# Patient Record
Sex: Female | Born: 2000 | Race: White | Hispanic: No | Marital: Single | Smoking: Never smoker
Health system: Southern US, Community
[De-identification: ages and names within clinical notes are randomized; demographics above are authoritative.]

## PROBLEM LIST (undated history)

## (undated) DIAGNOSIS — T7840XA Allergy, unspecified, initial encounter: Secondary | ICD-10-CM

## (undated) DIAGNOSIS — Z789 Other specified health status: Secondary | ICD-10-CM

## (undated) DIAGNOSIS — D649 Anemia, unspecified: Secondary | ICD-10-CM

## (undated) DIAGNOSIS — R112 Nausea with vomiting, unspecified: Secondary | ICD-10-CM

## (undated) DIAGNOSIS — Z9889 Other specified postprocedural states: Secondary | ICD-10-CM

## (undated) HISTORY — DX: Allergy, unspecified, initial encounter: T78.40XA

## (undated) NOTE — *Deleted (*Deleted)
[facility]   Telephone:([phone] [phone]   Clinic Follow up Note   Patient Care Team: Patient, No Pcp Per as PCP - General (General Practice)  Date of Service:  11/25/2020  CHIEF COMPLAINT: F/u of IDA   CURRENT THERAPY:  Oral liquid iron and Prenatal Vitamin  INTERVAL HISTORY: *** Janet Rojas is here for a follow up of IDA. She was last seen by me 1 year ago. She presents to the clinic alone.    REVIEW OF SYSTEMS:  *** Constitutional: Denies fevers, chills or abnormal weight loss Eyes: Denies blurriness of vision Ears, nose, mouth, throat, and face: Denies mucositis or sore throat Respiratory: Denies cough, dyspnea or wheezes Cardiovascular: Denies palpitation, chest discomfort or lower extremity swelling Gastrointestinal:  Denies nausea, heartburn or change in bowel habits Skin: Denies abnormal skin rashes Lymphatics: Denies new lymphadenopathy or easy bruising Neurological:Denies numbness, tingling or new weaknesses Behavioral/Psych: Mood is stable, no new changes  All other systems were reviewed with the patient and are negative.  MEDICAL HISTORY:  Past Medical History:  Diagnosis Date  . Allergy   . Anemia    IDA currently well controlled  . Medical history non-contributory   . PONV (postoperative nausea and vomiting)    sick after wisdom teeth    SURGICAL HISTORY: Past Surgical History:  Procedure Laterality Date  . TONSILLECTOMY AND ADENOIDECTOMY Bilateral 06/17/2020   Procedure: TONSILLECTOMY AND ADENOIDECTOMY;  Surgeon: Newman Pies, MD;  Location: [facility];  Service: ENT;  Laterality: Bilateral;  . WISDOM TOOTH EXTRACTION  2019    I have reviewed the social history and family history with the patient and they are unchanged from previous note.  ALLERGIES:  has No Known Allergies.  MEDICATIONS:  Current Outpatient Medications  Medication Sig Dispense Refill  . cholecalciferol (VITAMIN D3) 25 MCG (1000  UNIT) tablet Take 2,000 Units by mouth daily.    Marland Kitchen loratadine-pseudoephedrine (CLARITIN-D 12-HOUR) 5-120 MG tablet Take 1 tablet by mouth as needed for allergies.    . Prenatal Vit-Fe Fumarate-FA (MULTIVITAMIN-PRENATAL) 27-0.8 MG TABS tablet Take 1 tablet by mouth daily at 12 noon.    . triamcinolone ointment (KENALOG) 0.5 % Apply 1 application topically 2 (two) times daily. (Patient taking differently: Apply 1 application topically 2 (two) times daily as needed. ) 30 g 0   No current facility-administered medications for this visit.    PHYSICAL EXAMINATION: ECOG PERFORMANCE STATUS: {CHL ONC ECOG PS:[id number]}  There were no vitals filed for this visit. There were no vitals filed for this visit. *** GENERAL:alert, no distress and comfortable SKIN: skin color, texture, turgor are normal, no rashes or significant lesions EYES: normal, Conjunctiva are pink and non-injected, sclera clear {OROPHARYNX:no exudate, no erythema and lips, buccal mucosa, and tongue normal}  NECK: supple, thyroid normal size, non-tender, without nodularity LYMPH:  no palpable lymphadenopathy in the cervical, axillary {or inguinal} LUNGS: clear to auscultation and percussion with normal breathing effort HEART: regular rate & rhythm and no murmurs and no lower extremity edema ABDOMEN:abdomen soft, non-tender and normal bowel sounds Musculoskeletal:no cyanosis of digits and no clubbing  NEURO: alert & oriented x 3 with fluent speech, no focal motor/sensory deficits  LABORATORY DATA:  I have reviewed the data as listed CBC Latest Ref Rng & Units 06/05/2020 10/03/2019 05/24/2019  WBC 4.0 - 10.5 K/uL 8.5 9.8 7.8  Hemoglobin 12.0 - 15.0 g/dL 65.7 84.6 96.2  Hematocrit 36 - 46 % 42.3 42.8 41.9  Platelets 150 -  400 K/uL 257 271 288     CMP Latest Ref Rng & Units 06/05/2020 10/03/2019 05/24/2019  Glucose 70 - 99 mg/dL 83 89 161(W)  BUN 6 - 20 mg/dL 9 8 9   Creatinine 0.44 - 1.00 mg/dL 9.60 4.54 0.98  Sodium 135 - 145  mmol/L 139 140 138  Potassium 3.5 - 5.1 mmol/L 4.0 3.8 3.8  Chloride 98 - 111 mmol/L 104 105 104  CO2 22 - 32 mmol/L 29 26 28   Calcium 8.9 - 10.3 mg/dL 9.3 9.2 9.4  Total Protein 6.5 - 8.1 g/dL 7.5 7.7 7.8  Total Bilirubin 0.3 - 1.2 mg/dL 0.5 0.6 0.6  Alkaline Phos 38 - 126 U/L 78 86 85  AST 15 - 41 U/L 17 17 13(L)  ALT 0 - 44 U/L 18 17 14       RADIOGRAPHIC STUDIES: I have personally reviewed the radiological images as listed and agreed with the findings in the report. No results found.   ASSESSMENT & PLAN:  Janet Rojas is a 77 y.o. female with    1. Iron deficient Anemia, secondary to menorrhagia  -There is no record of anemia in Epic, but per patient she had low levels in the past. She still remains to have fatigue but able to function.  -I previously discussed this is related to her menorrhagia.  -She tried oral iron with upset stomach. She has been able to tolerate Prenatal vitamin daily much better, will continue and to try oral liquid iron.   -She has not required IV iron in the past, but she is interested. Given manageable levels, IV iron is not indicated at this time.  -Labs *** -Continue prenatal vitamin and try liquid oral iron 1-2 times a day after eating and take with Orange Juice.  -I also encouraged her to increase iron with read meat in her diet.  -Repeat labs in 6 months or sooner if symplasmatic.  -f/u in 1 year    2. Menorrhagia  -She continues to have heavy menses 4 days out of her regular monthly period.  -She is not on birth control. I recommend she f/u with Gyn to discuss starting birth control to manage her heavy periods.    PLAN:  *** -Continue prenatal vitamin and try liquid oral iron 1-2 times a day after eating and take with Orange Juice.  -Lab in 6 months  -Lab and f/u in 1 year.  -I called her mother and left her a VM to update her    No problem-specific Assessment & Plan notes found for this encounter.   No orders of the defined  types were placed in this encounter.  All questions were answered. The patient knows to call the clinic with any problems, questions or concerns. No barriers to learning was detected. The total time spent in the appointment was {CHL ONC TIME VISIT - [id number]}.     Delphina Cahill 11/25/2020   Rogelia Rohrer, am acting as scribe for Malachy Mood, MD.   {Add scribe attestation statement}

---
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
## 2019-05-15 ENCOUNTER — Telehealth: Payer: Self-pay | Admitting: Internal Medicine

## 2019-05-15 NOTE — Telephone Encounter (Signed)
Former [facility] patient scheduled for lab/fu per 5/15 schedule message. Confirmed with patient.

## 2019-05-23 ENCOUNTER — Other Ambulatory Visit: Payer: Self-pay | Admitting: Internal Medicine

## 2019-05-23 DIAGNOSIS — D500 Iron deficiency anemia secondary to blood loss (chronic): Secondary | ICD-10-CM

## 2019-05-24 ENCOUNTER — Other Ambulatory Visit: Payer: Self-pay

## 2019-05-24 ENCOUNTER — Inpatient Hospital Stay: Payer: Medicaid Other | Attending: Internal Medicine

## 2019-05-24 DIAGNOSIS — E611 Iron deficiency: Secondary | ICD-10-CM | POA: Insufficient documentation

## 2019-05-24 DIAGNOSIS — D500 Iron deficiency anemia secondary to blood loss (chronic): Secondary | ICD-10-CM

## 2019-05-24 LAB — CMP (CANCER CENTER ONLY)
ALT: 14 U/L (ref 0–44)
AST: 13 U/L — ABNORMAL LOW (ref 15–41)
Albumin: 4.2 g/dL (ref 3.5–5.0)
Alkaline Phosphatase: 85 U/L (ref 38–126)
Anion gap: 6 (ref 5–15)
BUN: 9 mg/dL (ref 6–20)
CO2: 28 mmol/L (ref 22–32)
Calcium: 9.4 mg/dL (ref 8.9–10.3)
Chloride: 104 mmol/L (ref 98–111)
Creatinine: 0.76 mg/dL (ref 0.44–1.00)
GFR, Est AFR Am: >60 mL/min (ref >60)
GFR, Estimated: >60 mL/min (ref >60)
Glucose, Bld: 105 mg/dL — ABNORMAL HIGH (ref 70–99)
Potassium: 3.8 mmol/L (ref 3.5–5.1)
Sodium: 138 mmol/L (ref 135–145)
Total Bilirubin: 0.6 mg/dL (ref 0.3–1.2)
Total Protein: 7.8 g/dL (ref 6.5–8.1)

## 2019-05-24 LAB — CBC WITH DIFFERENTIAL (CANCER CENTER ONLY)
Abs Immature Granulocytes: 0.01 10*3/uL (ref 0.00–0.07)
Basophils Absolute: 0.1 10*3/uL (ref 0.0–0.1)
Basophils Relative: 1 %
Eosinophils Absolute: 0.2 10*3/uL (ref 0.0–0.5)
Eosinophils Relative: 3 %
HCT: 41.9 % (ref 36.0–46.0)
Hemoglobin: 14.3 g/dL (ref 12.0–15.0)
Immature Granulocytes: 0 %
Lymphocytes Relative: 23 %
Lymphs Abs: 1.8 10*3/uL (ref 0.7–4.0)
MCH: 29.9 pg (ref 26.0–34.0)
MCHC: 34.1 g/dL (ref 30.0–36.0)
MCV: 87.5 fL (ref 80.0–100.0)
Monocytes Absolute: 0.6 10*3/uL (ref 0.1–1.0)
Monocytes Relative: 7 %
Neutro Abs: 5.2 10*3/uL (ref 1.7–7.7)
Neutrophils Relative %: 66 %
Platelet Count: 288 10*3/uL (ref 150–400)
RBC: 4.79 MIL/uL (ref 3.87–5.11)
RDW: 12.3 % (ref 11.5–15.5)
WBC Count: 7.8 10*3/uL (ref 4.0–10.5)
nRBC: 0 % (ref 0.0–0.2)

## 2019-05-24 LAB — IRON AND TIBC
Iron: 129 ug/dL (ref 41–142)
Saturation Ratios: 38 % (ref 21–57)
TIBC: 335 ug/dL (ref 236–444)
UIBC: 207 ug/dL (ref 120–384)

## 2019-05-24 LAB — FERRITIN: Ferritin: 28 ng/mL (ref 11–307)

## 2019-05-24 LAB — LACTATE DEHYDROGENASE: LDH: 135 U/L (ref 98–192)

## 2019-05-25 LAB — PROTEIN ELECTROPHORESIS, SERUM, WITH REFLEX
A/G Ratio: 1.2 (ref 0.7–1.7)
Albumin ELP: 3.9 g/dL (ref 2.9–4.4)
Alpha-1-Globulin: 0.2 g/dL (ref 0.0–0.4)
Alpha-2-Globulin: 0.7 g/dL (ref 0.4–1.0)
Beta Globulin: 1.1 g/dL (ref 0.7–1.3)
Gamma Globulin: 1.3 g/dL (ref 0.4–1.8)
Globulin, Total: 3.3 g/dL (ref 2.2–3.9)
Total Protein ELP: 7.2 g/dL (ref 6.0–8.5)

## 2019-05-31 ENCOUNTER — Inpatient Hospital Stay: Payer: Medicaid Other | Attending: Internal Medicine | Admitting: Internal Medicine

## 2019-05-31 ENCOUNTER — Other Ambulatory Visit: Payer: Self-pay

## 2019-05-31 VITALS — BP 105/95 | HR 83 | Temp 98.6°F | Resp 17 | Ht 62.5 in | Wt 158.6 lb

## 2019-05-31 DIAGNOSIS — D509 Iron deficiency anemia, unspecified: Secondary | ICD-10-CM | POA: Diagnosis not present

## 2019-05-31 DIAGNOSIS — Z79899 Other long term (current) drug therapy: Secondary | ICD-10-CM | POA: Diagnosis not present

## 2019-05-31 DIAGNOSIS — N920 Excessive and frequent menstruation with regular cycle: Secondary | ICD-10-CM | POA: Diagnosis not present

## 2019-05-31 DIAGNOSIS — D500 Iron deficiency anemia secondary to blood loss (chronic): Secondary | ICD-10-CM

## 2019-05-31 NOTE — Progress Notes (Signed)
Diagnosis No diagnosis found.  Staging Cancer Staging No matching staging information was found for the patient.  Assessment and Plan:  1.  Iron deficiency anemia.  Pt previously followed by Dr. Caron Presume.  She was previously recommended for oral iron. She reports she stopped taking oral iron several weeks ago.    Labs done 05/24/2019 reviewed and showed WBC 7.8 HB 14.3 plts 288,000.  Chemistries WNL with K+ 3.8 Cr 0.76 normal LFTs.  Ferritin low normal at 28.  SPEP negative.    At pt request, spoke with Mother on phone who was questioning option of IV iron which she reports she takes.  Pt has option of trial of IV iron or continue oral iron as HB WNL at 14.  Have given her options of trying Vitamin with iron if having problems tolerating oral iron.  Pt should follow-up with GYN if problems with menorrhagia.  Mother will notify the office if pt desires IV iron.  Pt will RTC in 11/2019 with labs and follow-up.    2.  Menorrhagia.  Likely etiology of IDA.  Pt should follow-up with GYN as directed.    3.  Compliance.  Pt reports she stopped taking oral iron several weeks ago.  Have given her options of trying Vitamin with iron if having problems tolerating oral iron.    4.  Fatigue.  TSH normal at 1.150.  Hb adequate at 14.  Follow-up with PCP if ongoing symptoms.    25 minutes spent with more than 50% spent in review of records, counseling and coordination of care.     Interval History:  Historical data obtained from note dated 10/18/2018.  Pt previously followed by Dr. Caron Presume.  Reportedly after she donated blood in October 2019, she became excessively fatigued.  Immediately following her phlebotomy, she had dizziness and lightheadedness.  She was subsequently notified by mail that she was mildly anemic. She was on iron by mouth 1 tablet once daily until November 2018.  Her compliance was sporadic.  She stopped taking iron this past spring. She does not know the formulation of iron she took.  It did,  however, create mild constipation.  Laboratory studies from April 25, 2018 reveal a complete blood count with hemoglobin 13.7 hematocrit 40.9 MCV 85.6 MCH 28.7 RDW 12.9 WBC 8.4 with 66% neutrophils 25% lymphocytes 7% monocytes 2% eosinophils 1% basophil; platelets 292,000.  Vitamin B12 607; folic acid 11.5.  A comprehensive metabolic panel showed sodium 139 potassium 4.1 chloride 104 CO2 27 glucose 86 BUN 16 creatinine 0.59 calcium 9.4 total protein 7.5 albumin 4.5 total bilirubin 0.4 alkaline phosphatase 72 SGOT 16 SGPT 12.  Her iron studies showed serum iron 43 TIBC 378 iron saturation 11%.  Vitamin D was 24.  She continues to menstruate on a regular basis over 6-7 days.  Her menses is heavy in the first 3 days.    Current Status:  Pt is seen today for follow-up to go over labs.    Problem List Patient Active Problem List   Diagnosis Date Noted  . Dietary iron deficiency without anemia [E61.1] 10/18/2018  . Vitamin D deficiency [E55.9] 10/18/2018  . Allergic rhinitis [J30.9] 10/18/2018    Past Medical History No past medical history on file.  Past Surgical History No past surgical history on file.  Family History No family history on file.   Social History  reports that she has never smoked. She has never used smokeless tobacco.  Medications  Current Outpatient Medications:  .  loratadine-pseudoephedrine (  CLARITIN-D 12-HOUR) 5-120 MG tablet, Take 1 tablet by mouth as needed for allergies., Disp: , Rfl:   Allergies Patient has no known allergies.  Review of Systems Review of Systems - Oncology ROS negative other than fatigue   Physical Exam  Vitals Wt Readings from Last 3 Encounters:  05/31/19 158 lb 9.6 oz (71.9 kg) (88 %, Z= 1.18)*  10/18/18 153 lb 14.4 oz (69.8 kg) (86 %, Z= 1.10)*  03/31/18 145 lb (65.8 kg) (81 %, Z= 0.88)*   * Growth percentiles are based on CDC (Girls, 2-20 Years) data.   Temp Readings from Last 3 Encounters:  05/31/19 98.6 F (37 C) (Oral)   10/18/18 98.7 F (37.1 C) (Oral)   BP Readings from Last 3 Encounters:  05/31/19 (!) 105/95  10/18/18 108/75  03/31/18 114/77 (71 %, Z = 0.57 /  91 %, Z = 1.37)*   *BP percentiles are based on the 2017 AAP Clinical Practice Guideline for girls   Pulse Readings from Last 3 Encounters:  05/31/19 83  10/18/18 76  03/31/18 83   Constitutional: Well-developed, well-nourished, and in no distress.   HENT: Head: Normocephalic and atraumatic.  Mouth/Throat: No oropharyngeal exudate. Mucosa moist. Eyes: Pupils are equal, round, and reactive to light. Conjunctivae are normal. No scleral icterus.  Neck: Normal range of motion. Neck supple. No JVD present.  Cardiovascular: Normal rate, regular rhythm and normal heart sounds.  Exam reveals no gallop and no friction rub.   No murmur heard. Pulmonary/Chest: Effort normal and breath sounds normal. No respiratory distress. No wheezes.No rales.  Abdominal: Soft. Bowel sounds are normal. No distension. There is no tenderness. There is no guarding.  Musculoskeletal: No edema or tenderness.  Lymphadenopathy: No cervical, axillary or supraclavicular adenopathy.  Neurological: Alert and oriented to person, place, and time. No cranial nerve deficit.  Skin: Skin is warm and dry. No rash noted. No erythema. No pallor.  Psychiatric: Affect and judgment normal.   Labs No visits with results within 3 Day(s) from this visit.  Latest known visit with results is:  Appointment on 05/24/2019  Component Date Value Ref Range Status  . Total Protein ELP 05/24/2019 7.2  6.0 - 8.5 g/dL Final  . Albumin ELP 16/10/960405/28/2020 3.9  2.9 - 4.4 g/dL Final  . VWUJW-1-XBJYNWGNAlpha-1-Globulin 05/24/2019 0.2  0.0 - 0.4 g/dL Final  . FAOZH-0-QMVHQIONAlpha-2-Globulin 05/24/2019 0.7  0.4 - 1.0 g/dL Final  . Beta Globulin 05/24/2019 1.1  0.7 - 1.3 g/dL Final  . Gamma Globulin 05/24/2019 1.3  0.4 - 1.8 g/dL Final  . M-Spike, % 62/95/284105/28/2020 Not Observed  Not Observed g/dL Final  . Globulin, Total 05/24/2019 3.3  2.2  - 3.9 g/dL Corrected  . A/G Ratio 05/24/2019 1.2  0.7 - 1.7 Corrected  . Comment 05/24/2019 Comment   Corrected   Comment: (NOTE) Protein electrophoresis scan will follow via computer, mail, or courier delivery.   Marland Kitchen. SPEP Interpretation 05/24/2019 Comment   Final   Comment: (NOTE) The SPE pattern appears unremarkable. Evidence of monoclonal protein is not apparent. Performed At: [facility] [address] [place], [place] [id number] Jolene SchimkeNagendra Sanjai MD OZ:[id number]:[phone]   . Iron 05/24/2019 129  41 - 142 ug/dL Final  . TIBC 25/95/638705/28/2020 335  236 - 444 ug/dL Final  . Saturation Ratios 05/24/2019 38  21 - 57 % Final  . UIBC 05/24/2019 207  120 - 384 ug/dL Final   Performed at [facility], [address] [place], [place] [address]  .  Ferritin 05/24/2019 28  11 - 307 ng/mL Final   Performed at [facility], [address], [place], [place] [address]  . LDH 05/24/2019 135  98 - 192 U/L Final   Performed at [facility], [address], [place], [place] [address]  . Sodium 05/24/2019 138  135 - 145 mmol/L Final  . Potassium 05/24/2019 3.8  3.5 - 5.1 mmol/L Final  . Chloride 05/24/2019 104  98 - 111 mmol/L Final  . CO2 05/24/2019 28  22 - 32 mmol/L Final  . Glucose, Bld 05/24/2019 105* 70 - 99 mg/dL Final  . BUN 03/55/9741 9  6 - 20 mg/dL Final  . Creatinine 63/84/5364 0.76  0.44 - 1.00 mg/dL Final  . Calcium 68/02/2121 9.4  8.9 - 10.3 mg/dL Final  . Total Protein 05/24/2019 7.8  6.5 - 8.1 g/dL Final  . Albumin 48/25/0037 4.2  3.5 - 5.0 g/dL Final  . AST 04/88/8916 13* 15 - 41 U/L Final  . ALT 05/24/2019 14  0 - 44 U/L Final  . Alkaline Phosphatase 05/24/2019 85  38 - 126 U/L Final  . Total Bilirubin 05/24/2019 0.6  0.3 - 1.2 mg/dL Final  . GFR, Est Non Af Am 05/24/2019 >60  >60 mL/min Final  . GFR, Est AFR Am 05/24/2019 >60  >60 mL/min Final  . Anion gap 05/24/2019 6  5 - 15 Final   Performed at [facility], [address] [place], [place] [address]  . WBC Count 05/24/2019 7.8  4.0 - 10.5 K/uL Final  . RBC 05/24/2019 4.79  3.87 - 5.11 MIL/uL Final  . Hemoglobin 05/24/2019 14.3  12.0 - 15.0 g/dL Final  . HCT 88/82/8003 41.9  36.0 - 46.0 % Final  . MCV 05/24/2019 87.5  80.0 - 100.0 fL Final  . MCH 05/24/2019 29.9  26.0 - 34.0 pg Final  . MCHC 05/24/2019 34.1  30.0 - 36.0 g/dL Final  . RDW 49/17/9150 12.3  11.5 - 15.5 % Final  . Platelet Count 05/24/2019 288  150 - 400 K/uL Final  . nRBC 05/24/2019 0.0  0.0 - 0.2 % Final  . Neutrophils Relative % 05/24/2019 66  % Final  . Neutro Abs 05/24/2019 5.2  1.7 - 7.7 K/uL Final  . Lymphocytes Relative 05/24/2019 23  % Final  . Lymphs Abs 05/24/2019 1.8  0.7 - 4.0 K/uL Final  . Monocytes Relative 05/24/2019 7  % Final  . Monocytes Absolute 05/24/2019 0.6  0.1 - 1.0 K/uL Final  . Eosinophils Relative 05/24/2019 3  % Final  . Eosinophils Absolute 05/24/2019 0.2  0.0 - 0.5 K/uL Final  . Basophils Relative 05/24/2019 1  % Final  . Basophils Absolute 05/24/2019 0.1  0.0 - 0.1 K/uL Final  . Immature Granulocytes 05/24/2019 0  % Final  . Abs Immature Granulocytes 05/24/2019 0.01  0.00 - 0.07 K/uL Final   Performed at [facility], [address] [place], [place] [address]     Pathology No orders of the defined types were placed in this encounter.      Ahmed Prima MD

## 2019-06-01 ENCOUNTER — Telehealth: Payer: Self-pay | Admitting: Internal Medicine

## 2019-06-01 NOTE — Telephone Encounter (Signed)
Will schedule once template is available °

## 2019-07-04 ENCOUNTER — Other Ambulatory Visit: Payer: Self-pay | Admitting: Pediatrics

## 2019-07-04 DIAGNOSIS — R1031 Right lower quadrant pain: Secondary | ICD-10-CM

## 2019-07-04 DIAGNOSIS — N83209 Unspecified ovarian cyst, unspecified side: Secondary | ICD-10-CM

## 2019-07-09 ENCOUNTER — Other Ambulatory Visit: Payer: Self-pay | Admitting: Pediatrics

## 2019-07-09 ENCOUNTER — Ambulatory Visit
Admission: RE | Admit: 2019-07-09 | Discharge: 2019-07-09 | Disposition: A | Discharge status: Home / Self Care | Payer: Medicaid Other | Source: Ambulatory Visit | Attending: Pediatrics | Admitting: Pediatrics

## 2019-07-09 DIAGNOSIS — R1031 Right lower quadrant pain: Secondary | ICD-10-CM

## 2019-07-09 DIAGNOSIS — N83209 Unspecified ovarian cyst, unspecified side: Secondary | ICD-10-CM

## 2019-09-25 ENCOUNTER — Telehealth: Payer: Self-pay | Admitting: Internal Medicine

## 2019-09-25 NOTE — Telephone Encounter (Signed)
I reviewed her chart, she has no anemia, iron level was only slightly low before, I doubt her fatigue is related to that. OK to have lab now and I will see her in Dec, unless her lab is significantly abnormal this time, please schedule, thanks  Truitt Merle MD

## 2019-09-25 NOTE — Telephone Encounter (Signed)
Former Lobbyist patient.   Returned call regarding request for appointment. Per 05/31/19 los patient to return for lab/fu December. Per patient/mom they tried the oral iron vs infusion and patient is still tired all the time and lacks energy. Per mom she just wants to make sure patient is healthy and counts are where they should be. Mom requesting lab and f/u if needed prior to December. Message to Dr. Burr Medico to see if patient can be brought in sooner.

## 2019-09-26 NOTE — Telephone Encounter (Signed)
[facility] transfer to [place]. Spoke with patient today regarding  coming in for lab 10/7 and f/u with Dr. Burr Medico 12/5. Per Dr. Burr Medico ok for lab now and f/u on December.

## 2019-10-02 ENCOUNTER — Other Ambulatory Visit: Payer: Self-pay

## 2019-10-02 DIAGNOSIS — D500 Iron deficiency anemia secondary to blood loss (chronic): Secondary | ICD-10-CM

## 2019-10-03 ENCOUNTER — Other Ambulatory Visit: Payer: Self-pay

## 2019-10-03 ENCOUNTER — Inpatient Hospital Stay: Payer: Medicaid Other | Attending: Hematology

## 2019-10-03 DIAGNOSIS — Z79899 Other long term (current) drug therapy: Secondary | ICD-10-CM | POA: Insufficient documentation

## 2019-10-03 DIAGNOSIS — D509 Iron deficiency anemia, unspecified: Secondary | ICD-10-CM | POA: Diagnosis present

## 2019-10-03 DIAGNOSIS — D500 Iron deficiency anemia secondary to blood loss (chronic): Secondary | ICD-10-CM

## 2019-10-03 DIAGNOSIS — N920 Excessive and frequent menstruation with regular cycle: Secondary | ICD-10-CM | POA: Insufficient documentation

## 2019-10-03 LAB — CBC WITH DIFFERENTIAL (CANCER CENTER ONLY)
Abs Immature Granulocytes: 0.03 10*3/uL (ref 0.00–0.07)
Basophils Absolute: 0.1 10*3/uL (ref 0.0–0.1)
Basophils Relative: 1 %
Eosinophils Absolute: 0.2 10*3/uL (ref 0.0–0.5)
Eosinophils Relative: 2 %
HCT: 42.8 % (ref 36.0–46.0)
Hemoglobin: 14.6 g/dL (ref 12.0–15.0)
Immature Granulocytes: 0 %
Lymphocytes Relative: 25 %
Lymphs Abs: 2.5 10*3/uL (ref 0.7–4.0)
MCH: 29.6 pg (ref 26.0–34.0)
MCHC: 34.1 g/dL (ref 30.0–36.0)
MCV: 86.6 fL (ref 80.0–100.0)
Monocytes Absolute: 0.8 10*3/uL (ref 0.1–1.0)
Monocytes Relative: 8 %
Neutro Abs: 6.2 10*3/uL (ref 1.7–7.7)
Neutrophils Relative %: 64 %
Platelet Count: 271 10*3/uL (ref 150–400)
RBC: 4.94 MIL/uL (ref 3.87–5.11)
RDW: 12.6 % (ref 11.5–15.5)
WBC Count: 9.8 10*3/uL (ref 4.0–10.5)
nRBC: 0 % (ref 0.0–0.2)

## 2019-10-03 LAB — CMP (CANCER CENTER ONLY)
ALT: 17 U/L (ref 0–44)
AST: 17 U/L (ref 15–41)
Albumin: 4.4 g/dL (ref 3.5–5.0)
Alkaline Phosphatase: 86 U/L (ref 38–126)
Anion gap: 9 (ref 5–15)
BUN: 8 mg/dL (ref 6–20)
CO2: 26 mmol/L (ref 22–32)
Calcium: 9.2 mg/dL (ref 8.9–10.3)
Chloride: 105 mmol/L (ref 98–111)
Creatinine: 0.72 mg/dL (ref 0.44–1.00)
GFR, Est AFR Am: >60 mL/min (ref >60)
GFR, Estimated: >60 mL/min (ref >60)
Glucose, Bld: 89 mg/dL (ref 70–99)
Potassium: 3.8 mmol/L (ref 3.5–5.1)
Sodium: 140 mmol/L (ref 135–145)
Total Bilirubin: 0.6 mg/dL (ref 0.3–1.2)
Total Protein: 7.7 g/dL (ref 6.5–8.1)

## 2019-10-03 LAB — LACTATE DEHYDROGENASE: LDH: 129 U/L (ref 98–192)

## 2019-10-03 LAB — FERRITIN: Ferritin: 16 ng/mL (ref 11–307)

## 2019-10-04 ENCOUNTER — Telehealth: Payer: Self-pay

## 2019-10-04 NOTE — Telephone Encounter (Signed)
-----   Message from Truitt Merle, MD sent at 10/03/2019 11:18 PM EDT ----- Please let pt or her mother know her lab results. She was last seen by Dr. Walden Field, scheduled to see me in Dec. Ferritin on low side normal, and trending down, no anemia or other abnormal lab. I do not think her fatigue is mainly related to her iron level, f/u with PCP. Please encourage her to take liquid oral iron, or prenatal vitamins (with iron). Let me know if she has additional concerns, thanks   Truitt Merle  10/03/2019

## 2019-10-04 NOTE — Telephone Encounter (Signed)
Left voice message for patient regarding lab results.  Patient saw Dr. Walden Field originally and will see Dr. Burr Medico in December.  Per Dr. Burr Medico informed her that Ferritin is on low side of normal and trending down, no anemia and no other abnormal labs.  Dr. Burr Medico does not think her fatigure is mainly related to her iron level.  She is recommending the patient follow up with her PCP.  Also instructed patient to take liquid oral iron or a prenatal vitamin with iron.  Encouraged her to call back if she has questions or concerns.

## 2019-11-19 NOTE — Progress Notes (Signed)
[place]   [phone] Fax:([phone]   Clinic Follow up Note   Patient Care Team: Marcelina Morel, MD as PCP - General (Pediatrics)  Date of Service:  11/28/2019  CHIEF COMPLAINT: F/u of IDA  CURRENT THERAPY:  Oral liquid iron and Prenatal Vitamin.   INTERVAL HISTORY:  Janet Rojas is here for a follow up of IDA. She was previously under the care of Dr Derrill Center and Dr. Walden Field and was last seen by her in 05/2019. She has now transferred her care to me. She notes she had anemia previously and does note remember how low it became. She notes when low she is fatigued and needs nap as needed. She notes she is able to function but always feels drained. She notes her period is regular, last 1 week and with 4 heavy days of bleeding. She has needed to heavy tampon every 2 hours. She notes she was on oral iron before but not consistent. When she did take it she had upset stomach. She has started prenatal vitamin and has tolerated better then oral iron.  She notes she is still in college at [employer]. She notes she exercises about 1 hour at a gym and only has SOB with high intensity.    REVIEW OF SYSTEMS:   Constitutional: Denies fevers, chills or abnormal weight loss Eyes: Denies blurriness of vision Ears, nose, mouth, throat, and face: Denies mucositis or sore throat Respiratory: Denies cough, dyspnea or wheezes Cardiovascular: Denies palpitation, chest discomfort or lower extremity swelling Gastrointestinal:  Denies nausea, heartburn or change in bowel habits Skin: Denies abnormal skin rashes Lymphatics: Denies new lymphadenopathy or easy bruising Neurological:Denies numbness, tingling or new weaknesses Behavioral/Psych: Mood is stable, no new changes  All other systems were reviewed with the patient and are negative.  MEDICAL HISTORY:  No past medical history on file.  SURGICAL HISTORY: No past surgical history on file.  I have reviewed the social history  and family history with the patient and they are unchanged from previous note.  ALLERGIES:  has No Known Allergies.  MEDICATIONS:  Current Outpatient Medications  Medication Sig Dispense Refill  . loratadine-pseudoephedrine (CLARITIN-D 12-HOUR) 5-120 MG tablet Take 1 tablet by mouth as needed for allergies.    . Prenatal Vit-Fe Fumarate-FA (MULTIVITAMIN-PRENATAL) 27-0.8 MG TABS tablet Take 1 tablet by mouth daily at 12 noon.     No current facility-administered medications for this visit.     PHYSICAL EXAMINATION: ECOG PERFORMANCE STATUS: 0 - Asymptomatic  Vitals:   11/28/19 1358  BP: 121/70  Pulse: 69  Resp: 18  Temp: 97.8 F (36.6 C)  SpO2: 100%   Filed Weights   11/28/19 1358  Weight: 161 lb 11.2 oz (73.3 kg)    GENERAL:alert, no distress and comfortable SKIN: skin color, texture, turgor are normal, no rashes or significant lesions EYES: normal, Conjunctiva are pink and non-injected, sclera clear  NECK: supple, thyroid normal size, non-tender, without nodularity LYMPH:  no palpable lymphadenopathy in the cervical, axillary  LUNGS: clear to auscultation and percussion with normal breathing effort HEART: regular rate & rhythm and no murmurs and no lower extremity edema ABDOMEN:abdomen soft, non-tender and normal bowel sounds Musculoskeletal:no cyanosis of digits and no clubbing  NEURO: alert & oriented x 3 with fluent speech, no focal motor/sensory deficits  LABORATORY DATA:  I have reviewed the data as listed CBC Latest Ref Rng & Units 10/03/2019 05/24/2019 10/18/2018  WBC 4.0 - 10.5 K/uL 9.8 7.8 8.4  Hemoglobin 12.0 -  15.0 g/dL 92.4 26.8 34.1  Hematocrit 36.0 - 46.0 % 42.8 41.9 39.4  Platelets 150 - 400 K/uL 271 288 285     CMP Latest Ref Rng & Units 10/03/2019 05/24/2019  Glucose 70 - 99 mg/dL 89 962(I)  BUN 6 - 20 mg/dL 8 9  Creatinine 2.97 - 1.00 mg/dL 9.89 2.11  Sodium 941 - 145 mmol/L 140 138  Potassium 3.5 - 5.1 mmol/L 3.8 3.8  Chloride 98 - 111 mmol/L  105 104  CO2 22 - 32 mmol/L 26 28  Calcium 8.9 - 10.3 mg/dL 9.2 9.4  Total Protein 6.5 - 8.1 g/dL 7.7 7.8  Total Bilirubin 0.3 - 1.2 mg/dL 0.6 0.6  Alkaline Phos 38 - 126 U/L 86 85  AST 15 - 41 U/L 17 13(L)  ALT 0 - 44 U/L 17 14      RADIOGRAPHIC STUDIES: I have personally reviewed the radiological images as listed and agreed with the findings in the report. No results found.   ASSESSMENT & PLAN:  Janet Rojas is a 18 y.o. female with   1. Iron deficient Anemia, secondary to menorrhagia  -I have reviewed her previous lab and medical records.There is no record of anemia in Epic, but per patient she had low levels in the past. She still remains to have fatigue but able to function.  -I discussed this is related to her menorrhagia.  -She tried oral iron with upset stomach. I discussed side effects are based on dose and should be taken after eating. She has been able to tolerate Prenatal vitamin daily much better, will continue and to try oral liquid iron.   -She has not required IV iron in the past, but she is interested. I reviewed possible side effects of allergic reaction. Given manageable levels, IV iron is not indicated at this time.  -Labs from 09/2019 reviewed, anemia resolved and ferratin in lower normal range. CBC and CMP WNL.  -Continue prenatal vitamin and try liquid oral iron 1-2 times a day after eating and take with Orange Juice.  -I also encouraged her to increase iron with read meat in her diet.  -Repeat labs in 6 months or sooner if symplasmatic.  -f/u in 1 year    2. Menorrhagia  -She continues to have heavy menses 4 days out of her regular monthly period.  -She is not on birth control. I recommend she f/u with Gyn to discuss starting birth control to manage her heavy periods.    PLAN:  -Continue prenatal vitamin and try liquid oral iron 1-2 times a day after eating and take with Orange Juice.  -Lab in 6 months  -Lab and f/u in 1 year.  -I called her mother  and left her a VM to update her    No problem-specific Assessment & Plan notes found for this encounter.   No orders of the defined types were placed in this encounter.  All questions were answered. The patient knows to call the clinic with any problems, questions or concerns. No barriers to learning was detected. I spent 20 minutes counseling the patient face to face. The total time spent in the appointment was 25 minutes and more than 50% was on counseling and review of test results     Malachy Mood, MD 11/28/2019   I, Delphina Cahill, am acting as scribe for Malachy Mood, MD.   I have reviewed the above documentation for accuracy and completeness, and I agree with the above.

## 2019-11-28 ENCOUNTER — Inpatient Hospital Stay: Payer: Medicaid Other | Attending: Hematology | Admitting: Hematology

## 2019-11-28 ENCOUNTER — Other Ambulatory Visit: Payer: Self-pay

## 2019-11-28 DIAGNOSIS — Z79899 Other long term (current) drug therapy: Secondary | ICD-10-CM | POA: Insufficient documentation

## 2019-11-28 DIAGNOSIS — N920 Excessive and frequent menstruation with regular cycle: Secondary | ICD-10-CM | POA: Diagnosis not present

## 2019-11-28 DIAGNOSIS — D500 Iron deficiency anemia secondary to blood loss (chronic): Secondary | ICD-10-CM | POA: Diagnosis present

## 2019-11-28 DIAGNOSIS — E611 Iron deficiency: Secondary | ICD-10-CM | POA: Diagnosis not present

## 2019-11-28 DIAGNOSIS — R5383 Other fatigue: Secondary | ICD-10-CM | POA: Diagnosis not present

## 2019-11-29 ENCOUNTER — Encounter: Payer: Self-pay | Admitting: Hematology

## 2019-11-29 ENCOUNTER — Telehealth: Payer: Self-pay | Admitting: Hematology

## 2019-11-29 DIAGNOSIS — E611 Iron deficiency: Secondary | ICD-10-CM | POA: Insufficient documentation

## 2019-11-29 NOTE — Telephone Encounter (Signed)
Scheduled appt per 12/2 los.  Left a VM of the appt date and time,

## 2020-05-27 ENCOUNTER — Other Ambulatory Visit: Payer: Self-pay

## 2020-05-27 DIAGNOSIS — E611 Iron deficiency: Secondary | ICD-10-CM

## 2020-05-28 ENCOUNTER — Inpatient Hospital Stay: Payer: Medicaid Other

## 2020-05-29 ENCOUNTER — Other Ambulatory Visit: Payer: Self-pay | Admitting: Otolaryngology

## 2020-06-05 ENCOUNTER — Inpatient Hospital Stay: Payer: Medicaid Other | Attending: Hematology

## 2020-06-05 ENCOUNTER — Other Ambulatory Visit: Payer: Self-pay

## 2020-06-05 DIAGNOSIS — R5383 Other fatigue: Secondary | ICD-10-CM | POA: Diagnosis not present

## 2020-06-05 DIAGNOSIS — N920 Excessive and frequent menstruation with regular cycle: Secondary | ICD-10-CM | POA: Insufficient documentation

## 2020-06-05 DIAGNOSIS — Z79899 Other long term (current) drug therapy: Secondary | ICD-10-CM | POA: Diagnosis not present

## 2020-06-05 DIAGNOSIS — D500 Iron deficiency anemia secondary to blood loss (chronic): Secondary | ICD-10-CM | POA: Diagnosis not present

## 2020-06-05 DIAGNOSIS — E611 Iron deficiency: Secondary | ICD-10-CM

## 2020-06-05 LAB — CMP (CANCER CENTER ONLY)
ALT: 18 U/L (ref 0–44)
AST: 17 U/L (ref 15–41)
Albumin: 4.2 g/dL (ref 3.5–5.0)
Alkaline Phosphatase: 78 U/L (ref 38–126)
Anion gap: 6 (ref 5–15)
BUN: 9 mg/dL (ref 6–20)
CO2: 29 mmol/L (ref 22–32)
Calcium: 9.3 mg/dL (ref 8.9–10.3)
Chloride: 104 mmol/L (ref 98–111)
Creatinine: 0.86 mg/dL (ref 0.44–1.00)
GFR, Est AFR Am: >60 mL/min (ref >60)
GFR, Estimated: >60 mL/min (ref >60)
Glucose, Bld: 83 mg/dL (ref 70–99)
Potassium: 4 mmol/L (ref 3.5–5.1)
Sodium: 139 mmol/L (ref 135–145)
Total Bilirubin: 0.5 mg/dL (ref 0.3–1.2)
Total Protein: 7.5 g/dL (ref 6.5–8.1)

## 2020-06-05 LAB — CBC WITH DIFFERENTIAL (CANCER CENTER ONLY)
Abs Immature Granulocytes: 0.03 10*3/uL (ref 0.00–0.07)
Basophils Absolute: 0.1 10*3/uL (ref 0.0–0.1)
Basophils Relative: 1 %
Eosinophils Absolute: 0.2 10*3/uL (ref 0.0–0.5)
Eosinophils Relative: 3 %
HCT: 42.3 % (ref 36.0–46.0)
Hemoglobin: 14.5 g/dL (ref 12.0–15.0)
Immature Granulocytes: 0 %
Lymphocytes Relative: 29 %
Lymphs Abs: 2.5 10*3/uL (ref 0.7–4.0)
MCH: 30.2 pg (ref 26.0–34.0)
MCHC: 34.3 g/dL (ref 30.0–36.0)
MCV: 88.1 fL (ref 80.0–100.0)
Monocytes Absolute: 0.8 10*3/uL (ref 0.1–1.0)
Monocytes Relative: 10 %
Neutro Abs: 4.8 10*3/uL (ref 1.7–7.7)
Neutrophils Relative %: 57 %
Platelet Count: 257 10*3/uL (ref 150–400)
RBC: 4.8 MIL/uL (ref 3.87–5.11)
RDW: 12.4 % (ref 11.5–15.5)
WBC Count: 8.5 10*3/uL (ref 4.0–10.5)
nRBC: 0 % (ref 0.0–0.2)

## 2020-06-06 LAB — IRON AND TIBC
Iron: 74 ug/dL (ref 41–142)
Saturation Ratios: 20 % — ABNORMAL LOW (ref 21–57)
TIBC: 363 ug/dL (ref 236–444)
UIBC: 289 ug/dL (ref 120–384)

## 2020-06-06 LAB — FERRITIN: Ferritin: 24 ng/mL (ref 11–307)

## 2020-06-09 ENCOUNTER — Other Ambulatory Visit: Payer: Self-pay

## 2020-06-09 ENCOUNTER — Encounter (HOSPITAL_BASED_OUTPATIENT_CLINIC_OR_DEPARTMENT_OTHER): Payer: Self-pay | Admitting: Otolaryngology

## 2020-06-10 ENCOUNTER — Other Ambulatory Visit: Payer: Self-pay | Admitting: Hematology

## 2020-06-14 ENCOUNTER — Other Ambulatory Visit (HOSPITAL_COMMUNITY)
Admission: RE | Admit: 2020-06-14 | Discharge: 2020-06-14 | Disposition: A | Discharge status: Home / Self Care | Payer: Medicaid Other | Source: Ambulatory Visit | Attending: Otolaryngology | Admitting: Otolaryngology

## 2020-06-14 DIAGNOSIS — Z20822 Contact with and (suspected) exposure to covid-19: Secondary | ICD-10-CM | POA: Insufficient documentation

## 2020-06-14 DIAGNOSIS — Z01812 Encounter for preprocedural laboratory examination: Secondary | ICD-10-CM | POA: Insufficient documentation

## 2020-06-14 LAB — SARS CORONAVIRUS 2 (TAT 6-24 HRS): SARS Coronavirus 2: NEGATIVE

## 2020-06-16 ENCOUNTER — Telehealth: Payer: Self-pay | Admitting: Emergency Medicine

## 2020-06-16 NOTE — Telephone Encounter (Addendum)
VM left for pt to call back regarding this note.  ----- Message from Malachy Mood, MD sent at 06/10/2020  7:48 AM EDT ----- Please let pt know her lab results, CBC normal, iron level good (on low side of normal range), continue MVI and oral iron, thanks   Malachy Mood

## 2020-06-17 ENCOUNTER — Other Ambulatory Visit: Payer: Self-pay

## 2020-06-17 ENCOUNTER — Ambulatory Visit (HOSPITAL_BASED_OUTPATIENT_CLINIC_OR_DEPARTMENT_OTHER): Payer: Medicaid Other | Admitting: Anesthesiology

## 2020-06-17 ENCOUNTER — Encounter (HOSPITAL_BASED_OUTPATIENT_CLINIC_OR_DEPARTMENT_OTHER): Payer: Self-pay | Admitting: Otolaryngology

## 2020-06-17 ENCOUNTER — Encounter (HOSPITAL_BASED_OUTPATIENT_CLINIC_OR_DEPARTMENT_OTHER)
Admission: RE | Disposition: A | Discharge status: Home / Self Care | Payer: Self-pay | Source: Home / Self Care | Attending: Otolaryngology

## 2020-06-17 ENCOUNTER — Ambulatory Visit (HOSPITAL_BASED_OUTPATIENT_CLINIC_OR_DEPARTMENT_OTHER)
Admission: RE | Admit: 2020-06-17 | Discharge: 2020-06-17 | Disposition: A | Discharge status: Home / Self Care | Payer: Medicaid Other | Attending: Otolaryngology | Admitting: Otolaryngology

## 2020-06-17 DIAGNOSIS — J3501 Chronic tonsillitis: Secondary | ICD-10-CM | POA: Diagnosis present

## 2020-06-17 DIAGNOSIS — J353 Hypertrophy of tonsils with hypertrophy of adenoids: Secondary | ICD-10-CM | POA: Diagnosis not present

## 2020-06-17 HISTORY — PX: TONSILLECTOMY AND ADENOIDECTOMY: SHX28

## 2020-06-17 HISTORY — DX: Other specified health status: Z78.9

## 2020-06-17 HISTORY — DX: Anemia, unspecified: D64.9

## 2020-06-17 HISTORY — DX: Nausea with vomiting, unspecified: R11.2

## 2020-06-17 HISTORY — DX: Other specified postprocedural states: Z98.890

## 2020-06-17 LAB — POCT PREGNANCY, URINE: Preg Test, Ur: NEGATIVE

## 2020-06-17 SURGERY — TONSILLECTOMY AND ADENOIDECTOMY
Anesthesia: General | Site: Throat | Laterality: Bilateral

## 2020-06-17 MED ORDER — ACETAMINOPHEN 500 MG PO TABS
ORAL_TABLET | ORAL | Status: AC
  Filled 2020-06-17: qty 2

## 2020-06-17 MED ORDER — SCOPOLAMINE 1 MG/3DAYS TD PT72
1.0000 | MEDICATED_PATCH | TRANSDERMAL | Status: DC
  Administered 2020-06-17: 1.5 mg via TRANSDERMAL

## 2020-06-17 MED ORDER — ONDANSETRON HCL 4 MG/2ML IJ SOLN
INTRAMUSCULAR | Status: DC | PRN
  Administered 2020-06-17: 4 mg via INTRAVENOUS

## 2020-06-17 MED ORDER — PROPOFOL 10 MG/ML IV BOLUS
INTRAVENOUS | Status: DC | PRN
  Administered 2020-06-17: 150 mg via INTRAVENOUS

## 2020-06-17 MED ORDER — MIDAZOLAM HCL 5 MG/5ML IJ SOLN
INTRAMUSCULAR | Status: DC | PRN
  Administered 2020-06-17: 2 mg via INTRAVENOUS

## 2020-06-17 MED ORDER — DEXAMETHASONE SODIUM PHOSPHATE 10 MG/ML IJ SOLN
INTRAMUSCULAR | Status: AC
  Filled 2020-06-17: qty 1

## 2020-06-17 MED ORDER — PROPOFOL 10 MG/ML IV BOLUS
INTRAVENOUS | Status: AC
  Filled 2020-06-17: qty 20

## 2020-06-17 MED ORDER — FENTANYL CITRATE (PF) 100 MCG/2ML IJ SOLN
INTRAMUSCULAR | Status: AC
  Filled 2020-06-17: qty 2

## 2020-06-17 MED ORDER — OXYCODONE HCL 5 MG/5ML PO SOLN: 5.0000 mg | Freq: Once | ORAL | Status: DC | PRN

## 2020-06-17 MED ORDER — ONDANSETRON HCL 4 MG/2ML IJ SOLN
INTRAMUSCULAR | Status: AC
  Filled 2020-06-17: qty 2

## 2020-06-17 MED ORDER — SCOPOLAMINE 1 MG/3DAYS TD PT72
MEDICATED_PATCH | TRANSDERMAL | Status: AC
  Filled 2020-06-17: qty 1

## 2020-06-17 MED ORDER — FENTANYL CITRATE (PF) 100 MCG/2ML IJ SOLN: 25.0000 ug | INTRAMUSCULAR | Status: DC | PRN

## 2020-06-17 MED ORDER — OXYMETAZOLINE HCL 0.05 % NA SOLN
NASAL | Status: DC | PRN
  Administered 2020-06-17: 1 via TOPICAL

## 2020-06-17 MED ORDER — GLYCOPYRROLATE 0.2 MG/ML IJ SOLN
INTRAMUSCULAR | Status: DC | PRN
  Administered 2020-06-17: .1 mg via INTRAVENOUS

## 2020-06-17 MED ORDER — ACETAMINOPHEN 500 MG PO TABS
1000.0000 mg | ORAL_TABLET | Freq: Once | ORAL | Status: AC
  Administered 2020-06-17: 1000 mg via ORAL

## 2020-06-17 MED ORDER — LIDOCAINE 2% (20 MG/ML) 5 ML SYRINGE
INTRAMUSCULAR | Status: AC
  Filled 2020-06-17: qty 5

## 2020-06-17 MED ORDER — LACTATED RINGERS IV SOLN: INTRAVENOUS | Status: DC

## 2020-06-17 MED ORDER — ROCURONIUM BROMIDE 100 MG/10ML IV SOLN
INTRAVENOUS | Status: DC | PRN
  Administered 2020-06-17: 50 mg via INTRAVENOUS

## 2020-06-17 MED ORDER — MIDAZOLAM HCL 2 MG/2ML IJ SOLN
INTRAMUSCULAR | Status: AC
  Filled 2020-06-17: qty 2

## 2020-06-17 MED ORDER — LIDOCAINE HCL (CARDIAC) PF 100 MG/5ML IV SOSY
PREFILLED_SYRINGE | INTRAVENOUS | Status: DC | PRN
  Administered 2020-06-17: 60 mg via INTRAVENOUS

## 2020-06-17 MED ORDER — ROCURONIUM BROMIDE 10 MG/ML (PF) SYRINGE
PREFILLED_SYRINGE | INTRAVENOUS | Status: AC
  Filled 2020-06-17: qty 10

## 2020-06-17 MED ORDER — DEXAMETHASONE SODIUM PHOSPHATE 4 MG/ML IJ SOLN
INTRAMUSCULAR | Status: DC | PRN
  Administered 2020-06-17: 10 mg via INTRAVENOUS

## 2020-06-17 MED ORDER — OXYCODONE HCL 5 MG PO TABS: 5.0000 mg | ORAL_TABLET | Freq: Once | ORAL | Status: DC | PRN

## 2020-06-17 MED ORDER — PROMETHAZINE HCL 25 MG/ML IJ SOLN: 6.2500 mg | INTRAMUSCULAR | Status: DC | PRN

## 2020-06-17 MED ORDER — FENTANYL CITRATE (PF) 100 MCG/2ML IJ SOLN
INTRAMUSCULAR | Status: DC | PRN
  Administered 2020-06-17: 50 ug via INTRAVENOUS
  Administered 2020-06-17: 25 ug via INTRAVENOUS
  Administered 2020-06-17: 50 ug via INTRAVENOUS
  Administered 2020-06-17: 25 ug via INTRAVENOUS

## 2020-06-17 MED ORDER — SODIUM CHLORIDE 0.9 % IR SOLN
Status: DC | PRN
  Administered 2020-06-17: 1

## 2020-06-17 MED ORDER — SUGAMMADEX SODIUM 200 MG/2ML IV SOLN
INTRAVENOUS | Status: DC | PRN
Start: 2020-06-17 — End: 2020-06-17
  Administered 2020-06-17: 200 mg via INTRAVENOUS

## 2020-06-17 MED ORDER — OXYCODONE-ACETAMINOPHEN 5-325 MG PO TABS: 1.0000 | ORAL_TABLET | ORAL | 0 refills | Status: AC | PRN

## 2020-06-17 SURGICAL SUPPLY — 36 items
BNDG COHESIVE 2X5 TAN STRL LF (GAUZE/BANDAGES/DRESSINGS) IMPLANT
CANISTER SUCT 1200ML W/VALVE (MISCELLANEOUS) ×3 IMPLANT
CATH ROBINSON RED A/P 10FR (CATHETERS) IMPLANT
CATH ROBINSON RED A/P 14FR (CATHETERS) ×3 IMPLANT
COAGULATOR SUCT 6 FR SWTCH (ELECTROSURGICAL)
COAGULATOR SUCT SWTCH 10FR 6 (ELECTROSURGICAL) IMPLANT
COVER BACK TABLE 60X90IN (DRAPES) ×3 IMPLANT
COVER MAYO STAND STRL (DRAPES) ×3 IMPLANT
COVER WAND RF STERILE (DRAPES) IMPLANT
ELECT REM PT RETURN 9FT ADLT (ELECTROSURGICAL) ×3
ELECT REM PT RETURN 9FT PED (ELECTROSURGICAL)
ELECTRODE REM PT RETRN 9FT PED (ELECTROSURGICAL) IMPLANT
ELECTRODE REM PT RTRN 9FT ADLT (ELECTROSURGICAL) ×1 IMPLANT
GAUZE SPONGE 4X4 12PLY STRL LF (GAUZE/BANDAGES/DRESSINGS) ×3 IMPLANT
GLOVE BIO SURGEON STRL SZ 6.5 (GLOVE) ×2 IMPLANT
GLOVE BIO SURGEON STRL SZ7.5 (GLOVE) ×3 IMPLANT
GLOVE BIO SURGEONS STRL SZ 6.5 (GLOVE) ×1
GLOVE BIOGEL M 6.5 STRL (GLOVE) ×3 IMPLANT
GLOVE BIOGEL PI IND STRL 7.0 (GLOVE) ×2 IMPLANT
GLOVE BIOGEL PI INDICATOR 7.0 (GLOVE) ×4
GOWN STRL REUS W/ TWL LRG LVL3 (GOWN DISPOSABLE) ×4 IMPLANT
GOWN STRL REUS W/TWL LRG LVL3 (GOWN DISPOSABLE) ×12
IV NS 500ML (IV SOLUTION) ×3
IV NS 500ML BAXH (IV SOLUTION) ×1 IMPLANT
MARKER SKIN DUAL TIP RULER LAB (MISCELLANEOUS) IMPLANT
NS IRRIG 1000ML POUR BTL (IV SOLUTION) ×3 IMPLANT
SHEET MEDIUM DRAPE 40X70 STRL (DRAPES) ×3 IMPLANT
SOLUTION BUTLER CLEAR DIP (MISCELLANEOUS) ×3 IMPLANT
SPONGE TONSIL TAPE 1.25 RFD (DISPOSABLE) ×3 IMPLANT
SYR BULB EAR ULCER 3OZ GRN STR (SYRINGE) IMPLANT
TOWEL GREEN STERILE FF (TOWEL DISPOSABLE) ×3 IMPLANT
TUBE CONNECTING 20'X1/4 (TUBING) ×1
TUBE CONNECTING 20X1/4 (TUBING) ×2 IMPLANT
TUBE SALEM SUMP 12R W/ARV (TUBING) IMPLANT
TUBE SALEM SUMP 16 FR W/ARV (TUBING) ×3 IMPLANT
WAND COBLATOR 70 EVAC XTRA (SURGICAL WAND) ×3 IMPLANT

## 2020-06-17 NOTE — Op Note (Signed)
DATE OF PROCEDURE:  06/17/2020                              OPERATIVE REPORT  SURGEON:  Newman Pies, MD  PREOPERATIVE DIAGNOSES: 1. Adenotonsillar hypertrophy. 2. Chronic tonsillitis and pharyngitis  POSTOPERATIVE DIAGNOSES: 1. Adenotonsillar hypertrophy. 2. Chronic tonsillitis and pharyngitis  PROCEDURE PERFORMED:  Adenotonsillectomy.  ANESTHESIA:  General endotracheal tube anesthesia.  COMPLICATIONS:  None.  ESTIMATED BLOOD LOSS:  Minimal.  INDICATION FOR PROCEDURE:  Janet Rojas is a 19 y.o. female with a history of chronic tonsillitis/pharyngitis and halitosis.  According to the patient, she has been experiencing chronic throat discomfort with halitosis for several years. The patient continued to be symptomatic despite medical treatments. On examination, the patient was noted to have bilateral cryptic tonsils, with numerous tonsilloliths. Based on the above findings, the decision was made for the patient to undergo the adenotonsillectomy procedure. Likelihood of success in reducing symptoms was also discussed.  The risks, benefits, alternatives, and details of the procedure were discussed with the patient.  Questions were invited and answered.  Informed consent was obtained.  DESCRIPTION:  The patient was taken to the operating room and placed supine on the operating table.  General endotracheal tube anesthesia was administered by the anesthesiologist.  The patient was positioned and prepped and draped in a standard fashion for adenotonsillectomy.  A Crowe-Davis mouth gag was inserted into the oral cavity for exposure. 3+ cryptic tonsils were noted bilaterally.  No bifidity was noted.  Indirect mirror examination of the nasopharynx revealed moderate adenoid hypertrophy. The adenoid was ablated with the Coblator device. Hemostasis was achieved with the Coblator device.  The right tonsil was then grasped with a straight Allis clamp and retracted medially.  It was resected free from the  underlying pharyngeal constrictor muscles with the Coblator device.  The same procedure was repeated on the left side without exception.  The surgical sites were copiously irrigated.  The mouth gag was removed.  The care of the patient was turned over to the anesthesiologist.  The patient was awakened from anesthesia without difficulty.  The patient was extubated and transferred to the recovery room in good condition.  OPERATIVE FINDINGS:  Adenotonsillar hypertrophy.  SPECIMEN:  None  FOLLOWUP CARE:  The patient will be discharged home once awake and alert.  She will be placed on amoxicillin 800 mg p.o. b.i.d. for 5 days, and percocet for postop pain control.   The patient will follow up in my office in approximately 2 weeks.  Rock Sobol W [name] 06/17/2020 9:04 AM

## 2020-06-17 NOTE — Anesthesia Procedure Notes (Signed)
Procedure Name: Intubation Date/Time: 06/17/2020 8:14 AM Performed by: Cleda Clarks, CRNA Pre-anesthesia Checklist: Patient identified, Emergency Drugs available, Suction available and Patient being monitored Patient Re-evaluated:Patient Re-evaluated prior to induction Oxygen Delivery Method: Circle system utilized Preoxygenation: Pre-oxygenation with 100% oxygen Induction Type: IV induction Ventilation: Mask ventilation without difficulty Laryngoscope Size: Miller and 2 Tube type: Oral Tube size: 7.0 mm Number of attempts: 1 Airway Equipment and Method: Stylet Placement Confirmation: ETT inserted through vocal cords under direct vision,  positive ETCO2 and breath sounds checked- equal and bilateral Secured at: 21 cm Tube secured with: Tape Dental Injury: Teeth and Oropharynx as per pre-operative assessment

## 2020-06-17 NOTE — Anesthesia Postprocedure Evaluation (Signed)
Anesthesia Post Note  Patient: Janet Rojas  Procedure(s) Performed: TONSILLECTOMY AND ADENOIDECTOMY (Bilateral Throat)     Patient location during evaluation: PACU Anesthesia Type: General Level of consciousness: awake and alert Pain management: pain level controlled Vital Signs Assessment: post-procedure vital signs reviewed and stable Respiratory status: spontaneous breathing, nonlabored ventilation, respiratory function stable and patient connected to nasal cannula oxygen Cardiovascular status: blood pressure returned to baseline and stable Postop Assessment: no apparent nausea or vomiting Anesthetic complications: no   No complications documented.  Last Vitals:  Vitals:   06/17/20 0954 06/17/20 1034  BP:  121/89  Pulse: 71 83  Resp: 15 16  Temp:  36.7 C  SpO2: 100% 98%    Last Pain:  Vitals:   06/17/20 1034  TempSrc:   PainSc: 0-No pain                 Kennieth Rad

## 2020-06-17 NOTE — Discharge Instructions (Signed)
NO TYLENOL PRODUCTS UNTIL 1:20 PM    Post Anesthesia Home Care Instructions  Activity: Get plenty of rest for the remainder of the day. A responsible individual must stay with you for 24 hours following the procedure.  For the next 24 hours, DO NOT: -Drive a car -Advertising copywriter -Drink alcoholic beverages -Take any medication unless instructed by your physician -Make any legal decisions or sign important papers.  Meals: Start with liquid foods such as gelatin or soup. Progress to regular foods as tolerated. Avoid greasy, spicy, heavy foods. If nausea and/or vomiting occur, drink only clear liquids until the nausea and/or vomiting subsides. Call your physician if vomiting continues.  Special Instructions/Symptoms: Your throat may feel dry or sore from the anesthesia or the breathing tube placed in your throat during surgery. If this causes discomfort, gargle with warm salt water. The discomfort should disappear within 24 hours.  If you had a scopolamine patch placed behind your ear for the management of post- operative nausea and/or vomiting:  1. The medication in the patch is effective for 72 hours, after which it should be removed.  Wrap patch in a tissue and discard in the trash. Wash hands thoroughly with soap and water. 2. You may remove the patch earlier than 72 hours if you experience unpleasant side effects which may include dry mouth, dizziness or visual disturbances. 3. Avoid touching the patch. Wash your hands with soap and water after contact with the patch.    -----------   Janet Rojas Janet Rojas M.D., P.A. Postoperative Instructions for Tonsillectomy & Adenoidectomy (T&A) Activity Restrict activity at home for the first two days, resting as much as possible. Light indoor activity is best. You may usually return to school or work within a week but void strenuous activity and sports for two weeks. Sleep with your head elevated on 2-3 pillows for 3-4 days to help decrease  swelling. Diet Due to tissue swelling and throat discomfort, you may have little desire to drink for several days. However fluids are very important to prevent dehydration. You will find that non-acidic juices, soups, popsicles, Jell-O, custard, puddings, and any soft or mashed foods taken in small quantities can be swallowed fairly easily. Try to increase your fluid and food intake as the discomfort subsides. It is recommended that a child receive 1-1/2 quarts of fluid in a 24-hour period. Adult require twice this amount.  Discomfort Your sore throat may be relieved by applying an ice collar to your neck and/or by taking Tylenol. You may experience an earache, which is due to referred pain from the throat. Referred ear pain is commonly felt at night when trying to rest.  Bleeding                        Although rare, there is risk of having some bleeding during the first 2 weeks after having a T&A. This usually happens between days 7-10 postoperatively. If you or your child should have any bleeding, try to remain calm. We recommend sitting up quietly in a chair and gently spitting out the blood into a bowl. For adults, gargling gently with ice water may help. If the bleeding does not stop after a short time (5 minutes), is more than 1 teaspoonful, or if you become worried, please call our office at [phone] or go directly to the nearest hospital emergency room. Do not eat or drink anything prior to going to the hospital as you may need to be  taken to the operating room in order to control the bleeding. GENERAL CONSIDERATIONS 1. Brush your teeth regularly. Avoid mouthwashes and gargles for three weeks. You may gargle gently with warm salt-water as necessary or spray with Chloraseptic. You may make salt-water by placing 2 teaspoons of table salt into a quart of fresh water. Warm the salt-water in a microwave to a luke warm temperature.  2. Avoid exposure to colds and upper respiratory infections if  possible.  3. If you look into a mirror or into your child's mouth, you will see white-gray patches in the back of the throat. This is normal after having a T&A and is like a scab that forms on the skin after an abrasion. It will disappear once the back of the throat heals completely. However, it may cause a noticeable odor; this too will disappear with time. Again, warm salt-water gargles may be used to help keep the throat clean and promote healing.  4. You may notice a temporary change in voice quality, such as a higher pitched voice or a nasal sound, until healing is complete. This may last for 1-2 weeks and should resolve.  5. Do not take or give you child any medications that we have not prescribed or recommended.  6. Snoring may occur, especially at night, for the first week after a T&A. It is due to swelling of the soft palate and will usually resolve.  Please call our office at [phone] if you have any questions.

## 2020-06-17 NOTE — Anesthesia Preprocedure Evaluation (Signed)
Anesthesia Evaluation  Patient identified by MRN, date of birth, ID band Patient awake    Reviewed: Allergy & Precautions, NPO status , Patient's Chart, lab work & pertinent test results  History of Anesthesia Complications (+) PONV  Airway Mallampati: II  TM Distance: >3 FB Neck ROM: Full    Dental  (+) Dental Advisory Given   Pulmonary neg pulmonary ROS,    breath sounds clear to auscultation       Cardiovascular negative cardio ROS   Rhythm:Regular Rate:Normal     Neuro/Psych negative neurological ROS     GI/Hepatic negative GI ROS, Neg liver ROS,   Endo/Other  negative endocrine ROS  Renal/GU negative Renal ROS     Musculoskeletal   Abdominal   Peds  Hematology  (+) anemia ,   Anesthesia Other Findings   Reproductive/Obstetrics                             Anesthesia Physical Anesthesia Plan  ASA: I  Anesthesia Plan: General   Post-op Pain Management:    Induction: Intravenous  PONV Risk Score and Plan: 4 or greater and Dexamethasone, Ondansetron, Midazolam and Treatment may vary due to age or medical condition  Airway Management Planned: Oral ETT  Additional Equipment: None  Intra-op Plan:   Post-operative Plan: Extubation in OR  Informed Consent: I have reviewed the patients History and Physical, chart, labs and discussed the procedure including the risks, benefits and alternatives for the proposed anesthesia with the patient or authorized representative who has indicated his/her understanding and acceptance.     Dental advisory given  Plan Discussed with: CRNA  Anesthesia Plan Comments:         Anesthesia Quick Evaluation

## 2020-06-17 NOTE — H&P (Signed)
Cc: Recurrent tonsillitis, halitosis  HPI: The patient is a 19 y/o female who presents today with her mother. The patient is seen in consultation requested by [facility]. The patient has been experiencing recurrent tonsillitis and strep for  many years. She also has frequent tonsil stones with associated halitosis. No snoring or apnea is noted. She was last treated a few months ago for tonsillitis. The patient is otherwise healthy. No previous ENT surgery is noted.   The patient's review of systems (constitutional, eyes, ENT, cardiovascular, respiratory, GI, musculoskeletal, skin, neurologic, psychiatric, endocrine, hematologic, allergic) is noted in the ROS questionnaire.  It is reviewed with the patient.   Family health history: Diabetes.  Major events: None.  Ongoing medical problems: Anemia, allergies.  Social history: The patient is single. She denies the use of tobacco, alcohol or illegal drugs.   Exam: General: Communicates without difficulty, well nourished, no acute distress. Head:  Normocephalic, no lesions or asymmetry. Eyes: PERRL, EOMI. No scleral icterus, conjunctivae clear.  Neuro: CN II exam reveals vision grossly intact.  No nystagmus at any point of gaze. Ears:  EAC normal without erythema AU.  TM intact without fluid and mobile AU. Nose: Moist, pink mucosa without lesions or mass. Mouth: Oral cavity clear and moist, no lesions, tonsils symmetric. Tonsils are 3+. Tonsils with mild erythema. Neck: Full range of motion, no lymphadenopathy or masses.   Assessment 1.  The patient's history and physical exam findings are consistent with chronic tonsillitis/pharyngitis secondary to adenotonsillar hypertrophy.  Plan  1. The treatment options include continuing conservative observation versus adenotonsillectomy.  Based on the patient's history and physical exam findings, the patient will likely benefit from having the tonsils and adenoid removed.  The risks,  benefits, alternatives, and details of the procedure are reviewed with the patient and the parent.  Questions are invited and answered.  2. The mother and patient are interested in proceeding with the procedure.  We will schedule the procedure in accordance with the family schedule.

## 2020-06-17 NOTE — Transfer of Care (Signed)
Immediate Anesthesia Transfer of Care Note  Patient: Janet Rojas  Procedure(s) Performed: TONSILLECTOMY AND ADENOIDECTOMY (Bilateral Throat)  Patient Location: PACU  Anesthesia Type:General  Level of Consciousness: sedated  Airway & Oxygen Therapy: Patient Spontanous Breathing and Patient connected to nasal cannula oxygen  Post-op Assessment: Report given to RN and Post -op Vital signs reviewed and stable  Post vital signs: Reviewed and stable  Last Vitals:  Vitals Value Taken Time  BP    Temp    Pulse 79 06/17/20 0905  Resp 17 06/17/20 0905  SpO2 97 % 06/17/20 0905  Vitals shown include unvalidated device data.  Last Pain:  Vitals:   06/17/20 0700  TempSrc: Oral  PainSc: 0-No pain         Complications: No complications documented.

## 2020-06-18 ENCOUNTER — Encounter (HOSPITAL_BASED_OUTPATIENT_CLINIC_OR_DEPARTMENT_OTHER): Payer: Self-pay | Admitting: Otolaryngology

## 2020-09-17 ENCOUNTER — Ambulatory Visit: Payer: Medicaid Other | Admitting: Gastroenterology

## 2020-10-03 ENCOUNTER — Encounter: Payer: Self-pay | Admitting: Emergency Medicine

## 2020-10-03 ENCOUNTER — Ambulatory Visit
Admission: EM | Admit: 2020-10-03 | Discharge: 2020-10-03 | Disposition: A | Discharge status: Home / Self Care | Payer: Medicaid Other | Attending: Emergency Medicine | Admitting: Emergency Medicine

## 2020-10-03 DIAGNOSIS — L309 Dermatitis, unspecified: Secondary | ICD-10-CM

## 2020-10-03 MED ORDER — TRIAMCINOLONE ACETONIDE 0.5 % EX OINT
1.0000 | TOPICAL_OINTMENT | Freq: Two times a day (BID) | CUTANEOUS | 0 refills | Status: DC
Start: 2020-10-03 — End: 2023-07-04

## 2020-10-03 NOTE — Discharge Instructions (Addendum)
Keep skin clean and dry. May apply triamcinolone twice daily x1 week. Use Hydroxyzine before bedtime. Avoid hot water as this can further dry out and irritate skin. Important to wash all clothes, bedding, blankets in hot water. Return for worsening rash, pain, swelling, redness, fever. 

## 2020-10-03 NOTE — ED Provider Notes (Signed)
[name]    CSN: [id number] Arrival date & time: 10/03/20  1834      History   Chief Complaint Chief Complaint  Patient presents with  . Rash    HPI Janet Rojas is a 19 y.o. female  Presenting for dry, pruritic skin to upper lip since yesterday.  Denies change in diet, lifestyle, occasions.  No fever, throbs, myalgias, sore throat, nasal congestion.  Has not taken thing for this.  Past Medical History:  Diagnosis Date  . Anemia    IDA currently well controlled  . Medical history non-contributory   . PONV (postoperative nausea and vomiting)    sick after wisdom teeth    Patient Active Problem List   Diagnosis Date Noted  . Iron deficiency 11/29/2019  . Dietary iron deficiency without anemia 10/18/2018  . Vitamin D deficiency 10/18/2018  . Allergic rhinitis 10/18/2018    Past Surgical History:  Procedure Laterality Date  . TONSILLECTOMY AND ADENOIDECTOMY Bilateral 06/17/2020   Procedure: TONSILLECTOMY AND ADENOIDECTOMY;  Surgeon: Newman Pies, MD;  Location: [facility];  Service: ENT;  Laterality: Bilateral;  . WISDOM TOOTH EXTRACTION  2019    OB History   No obstetric history on file.      Home Medications    Prior to Admission medications   Medication Sig Start Date End Date Taking? Authorizing Provider  cholecalciferol (VITAMIN D3) 25 MCG (1000 UNIT) tablet Take 2,000 Units by mouth daily.    [provider]  clindamycin (CLEOCIN) 300 MG capsule Take 300 mg by mouth 3 (three) times daily.    [provider]  loratadine-pseudoephedrine (CLARITIN-D 12-HOUR) 5-120 MG tablet Take 1 tablet by mouth as needed for allergies.    [provider]  Prenatal Vit-Fe Fumarate-FA (MULTIVITAMIN-PRENATAL) 27-0.8 MG TABS tablet Take 1 tablet by mouth daily at 12 noon.    [provider]  triamcinolone ointment (KENALOG) 0.5 % Apply 1 application topically 2 (two) times daily. 10/03/20   Hall-Potvin, [place],  PA-C    Family History History reviewed. No pertinent family history.  Social History Social History   Tobacco Use  . Smoking status: Never Smoker  . Smokeless tobacco: Never Used  Substance Use Topics  . Alcohol use: Never  . Drug use: Not on file     Allergies   Patient has no known allergies.   Review of Systems As per HPI   Physical Exam Triage Vital Signs ED Triage Vitals [10/03/20 1935]  Enc Vitals Group     BP 122/77     Pulse Rate 73     Resp 16     Temp 98 F (36.7 C)     Temp Source Oral     SpO2 100 %     Weight      Height      Head Circumference      Peak Flow      Pain Score      Pain Loc      Pain Edu?      Excl. in GC?    No data found.  Updated Vital Signs BP 122/77 (BP Location: Left Arm)   Pulse 73   Temp 98 F (36.7 C) (Oral)   Resp 16   SpO2 100%   Visual Acuity Right Eye Distance:   Left Eye Distance:   Bilateral Distance:    Right Eye Near:   Left Eye Near:    Bilateral Near:     Physical Exam  Constitutional:      General: She is not in acute distress. HENT:     Head: Normocephalic and atraumatic.  Eyes:     General: No scleral icterus.    Pupils: Pupils are equal, round, and reactive to light.  Cardiovascular:     Rate and Rhythm: Normal rate.  Pulmonary:     Effort: Pulmonary effort is normal.  Skin:    Coloration: Skin is not jaundiced or pale.     Findings: Rash present.     Comments: Dry dermatitis noted to upper lip.  No ulcerations, lesions, blisters.  Neurological:     Mental Status: She is alert and oriented to person, place, and time.      UC Treatments / Results  Labs (all labs ordered are listed, but only abnormal results are displayed) Labs Reviewed - No data to display  EKG   Radiology No results found.  Procedures Procedures (including critical care time)  Medications Ordered in UC Medications - No data to display  Initial Impression / Assessment and Plan / UC Course  I have  reviewed the triage vital signs and the nursing notes.  Pertinent labs & imaging results that were available during my care of the patient were reviewed by me and considered in my medical decision making (see chart for details).     We will treat dermatitis supportively as outlined below.  Return precautions discussed, pt verbalized understanding and is agreeable to plan. Final Clinical Impressions(s) / UC Diagnoses   Final diagnoses:  Dermatitis     Discharge Instructions     Keep skin clean and dry. May apply triamcinolone twice daily x1 week. Use Hydroxyzine before bedtime. Avoid hot water as this can further dry out and irritate skin. Important to wash all clothes, bedding, blankets in hot water. Return for worsening rash, pain, swelling, redness, fever.    ED Prescriptions    Medication Sig Dispense Auth. Provider   triamcinolone ointment (KENALOG) 0.5 % Apply 1 application topically 2 (two) times daily. 30 g Hall-Potvin, [place], PA-C     PDMP not reviewed this encounter.   Hall-Potvin, [place], [place] 10/03/20 1950

## 2020-11-02 ENCOUNTER — Ambulatory Visit
Admission: EM | Admit: 2020-11-02 | Discharge: 2020-11-02 | Disposition: A | Discharge status: Home / Self Care | Payer: Medicaid Other | Attending: Physician Assistant | Admitting: Physician Assistant

## 2020-11-02 ENCOUNTER — Other Ambulatory Visit: Payer: Self-pay

## 2020-11-02 ENCOUNTER — Encounter: Payer: Self-pay | Admitting: Emergency Medicine

## 2020-11-02 DIAGNOSIS — N760 Acute vaginitis: Secondary | ICD-10-CM | POA: Diagnosis present

## 2020-11-02 DIAGNOSIS — N3090 Cystitis, unspecified without hematuria: Secondary | ICD-10-CM | POA: Diagnosis present

## 2020-11-02 LAB — POCT URINALYSIS DIP (MANUAL ENTRY)
Bilirubin, UA: NEGATIVE
Glucose, UA: NEGATIVE mg/dL
Ketones, POC UA: NEGATIVE mg/dL
Nitrite, UA: NEGATIVE
Protein Ur, POC: NEGATIVE mg/dL
Spec Grav, UA: 1.015 (ref 1.010–1.025)
Urobilinogen, UA: 0.2 E.U./dL
pH, UA: 7 (ref 5.0–8.0)

## 2020-11-02 MED ORDER — CEPHALEXIN 500 MG PO CAPS
500.0000 mg | ORAL_CAPSULE | Freq: Two times a day (BID) | ORAL | 0 refills | Status: DC
Start: 2020-11-02 — End: 2020-11-10

## 2020-11-02 MED ORDER — METRONIDAZOLE 0.75 % VA GEL: 1.0000 | Freq: Every day | VAGINAL | 0 refills | Status: DC

## 2020-11-02 NOTE — ED Provider Notes (Signed)
[name]    CSN: [id number] Arrival date & time: 11/02/20  1235      History   Chief Complaint Chief Complaint  Patient presents with  . Dysuria    HPI Janet Rojas is a 19 y.o. female.   19 year old female comes in for 3 day history of urinary symptoms. Dysuria, frequency. Denies hematuria. Denies abdominal pain, nausea, vomiting. Denies fever, chills, flank/back pain. Vaginal discharge with odor. Denies itching, spotting. LMP 10/15/2020. Sexually active 1 female partner, no condom use. No birth control use.      Past Medical History:  Diagnosis Date  . Anemia    IDA currently well controlled  . Medical history non-contributory   . PONV (postoperative nausea and vomiting)    sick after wisdom teeth    Patient Active Problem List   Diagnosis Date Noted  . Iron deficiency 11/29/2019  . Dietary iron deficiency without anemia 10/18/2018  . Vitamin D deficiency 10/18/2018  . Allergic rhinitis 10/18/2018    Past Surgical History:  Procedure Laterality Date  . TONSILLECTOMY AND ADENOIDECTOMY Bilateral 06/17/2020   Procedure: TONSILLECTOMY AND ADENOIDECTOMY;  Surgeon: Newman Pies, MD;  Location: [facility];  Service: ENT;  Laterality: Bilateral;  . WISDOM TOOTH EXTRACTION  2019    OB History   No obstetric history on file.      Home Medications    Prior to Admission medications   Medication Sig Start Date End Date Taking? Authorizing Provider  cephALEXin (KEFLEX) 500 MG capsule Take 1 capsule (500 mg total) by mouth 2 (two) times daily. 11/02/20   Cathie Hoops, Giselle Brutus V, PA-C  cholecalciferol (VITAMIN D3) 25 MCG (1000 UNIT) tablet Take 2,000 Units by mouth daily.    [provider]  loratadine-pseudoephedrine (CLARITIN-D 12-HOUR) 5-120 MG tablet Take 1 tablet by mouth as needed for allergies.    [provider]  metroNIDAZOLE (METROGEL VAGINAL) 0.75 % vaginal gel Place 1 Applicatorful vaginally at bedtime. 11/02/20   Cathie Hoops, Abishai Viegas V, PA-C   Prenatal Vit-Fe Fumarate-FA (MULTIVITAMIN-PRENATAL) 27-0.8 MG TABS tablet Take 1 tablet by mouth daily at 12 noon.    [provider]  triamcinolone ointment (KENALOG) 0.5 % Apply 1 application topically 2 (two) times daily. 10/03/20   Hall-Potvin, [place], PA-C    Family History Family History  Family history unknown: Yes    Social History Social History   Tobacco Use  . Smoking status: Never Smoker  . Smokeless tobacco: Never Used  Substance Use Topics  . Alcohol use: Never  . Drug use: Not on file     Allergies   Patient has no known allergies.   Review of Systems Review of Systems  Reason unable to perform ROS: See HPI as above.     Physical Exam Triage Vital Signs ED Triage Vitals [11/02/20 1417]  Enc Vitals Group     BP 124/88     Pulse Rate 87     Resp 18     Temp 97.9 F (36.6 C)     Temp Source Oral     SpO2 98 %     Weight      Height      Head Circumference      Peak Flow      Pain Score 3     Pain Loc      Pain Edu?      Excl. in GC?    No data found.  Updated Vital Signs BP 124/88 (BP Location:  Right Arm)   Pulse 87   Temp 97.9 F (36.6 C) (Oral)   Resp 18   SpO2 98%   Physical Exam Constitutional:      General: She is not in acute distress.    Appearance: Normal appearance. She is well-developed. She is not toxic-appearing or diaphoretic.  HENT:     Head: Normocephalic and atraumatic.  Eyes:     Conjunctiva/sclera: Conjunctivae normal.     Pupils: Pupils are equal, round, and reactive to light.  Cardiovascular:     Rate and Rhythm: Normal rate and regular rhythm.  Pulmonary:     Effort: Pulmonary effort is normal. No respiratory distress.     Comments: LCTAB Abdominal:     Tenderness: There is no right CVA tenderness or left CVA tenderness.  Musculoskeletal:     Cervical back: Normal range of motion and neck supple.  Skin:    General: Skin is warm and dry.  Neurological:     Mental Status: She is alert and  oriented to person, place, and time.      UC Treatments / Results  Labs (all labs ordered are listed, but only abnormal results are displayed) Labs Reviewed  POCT URINALYSIS DIP (MANUAL ENTRY) - Abnormal; Notable for the following components:      Result Value   Clarity, UA hazy (*)    Blood, UA trace-intact (*)    Leukocytes, UA Small (1+) (*)    All other components within normal limits  URINE CULTURE    EKG   Radiology No results found.  Procedures Procedures (including critical care time)  Medications Ordered in UC Medications - No data to display  Initial Impression / Assessment and Plan / UC Course  I have reviewed the triage vital signs and the nursing notes.  Pertinent labs & imaging results that were available during my care of the patient were reviewed by me and considered in my medical decision making (see chart for details).    Urine dipstick positive for UTI. Start keflex as directed. metrogel for possible BV. patient without worries for STDs. Push fluids. Return precautions given.  Final Clinical Impressions(s) / UC Diagnoses   Final diagnoses:  Cystitis  Acute vaginitis    ED Prescriptions    Medication Sig Dispense Auth. Provider   cephALEXin (KEFLEX) 500 MG capsule Take 1 capsule (500 mg total) by mouth 2 (two) times daily. 10 capsule Ford Peddie V, PA-C   metroNIDAZOLE (METROGEL VAGINAL) 0.75 % vaginal gel Place 1 Applicatorful vaginally at bedtime. 50 g Belinda Fisher, PA-C     PDMP not reviewed this encounter.   Belinda Fisher, PA-C 11/02/20 1500

## 2020-11-02 NOTE — Discharge Instructions (Signed)
Your urine was positive for an urinary tract infection. Start keflex as directed. Keep hydrated, urine should be clear to pale yellow in color. Metrogel for BV. Monitor for any worsening of symptoms, fever, worsening abdominal pain, nausea/vomiting, flank pain, follow up for reevaluation.

## 2020-11-02 NOTE — ED Triage Notes (Signed)
Pt here for UTI sx with dysuria and frequency x 3 days

## 2020-11-05 LAB — URINE CULTURE: Culture: >=100000 — AB

## 2020-11-10 ENCOUNTER — Encounter: Payer: Self-pay | Admitting: Gastroenterology

## 2020-11-10 ENCOUNTER — Ambulatory Visit (INDEPENDENT_AMBULATORY_CARE_PROVIDER_SITE_OTHER): Payer: Medicaid Other | Admitting: Gastroenterology

## 2020-11-10 VITALS — BP 96/60 | HR 89 | Ht 61.0 in | Wt 163.0 lb

## 2020-11-10 DIAGNOSIS — R194 Change in bowel habit: Secondary | ICD-10-CM

## 2020-11-10 DIAGNOSIS — K5900 Constipation, unspecified: Secondary | ICD-10-CM | POA: Diagnosis not present

## 2020-11-10 NOTE — Progress Notes (Signed)
Janet Rojas    [id number]    01/16/2001  Primary Care Physician:Patient, No Pcp Per  Referring Physician: Armandina Stammer, MD [address] 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  She started having progressive worsening of bowel habits sometime in the late summer.  She never had constipation prior to this recent changes in bowel habits.  She was having bowel movement once or twice a week over the summer, slightly improved after taking MiraLAX but she is having to take increasing amount of MiraLAX to even have 1 small bowel movement per day.  Denies abdominal pain but has abdominal bloating and generalized cramping.  Also has intermittent nausea.  Denies any recent dietary changes or medication changes.  She was taking prenatal vitamins with iron on and off but has not taken them recently.  She also has not taken any Claritin or over-the-counter meds recently.  No family history of GI malignancy.    Outpatient Encounter Medications as of 11/10/2020  Medication Sig  . cholecalciferol (VITAMIN D3) 25 MCG (1000 UNIT) tablet Take 2,000 Units by mouth daily.  Marland Kitchen loratadine-pseudoephedrine (CLARITIN-D 12-HOUR) 5-120 MG tablet Take 1 tablet by mouth as needed for allergies.  . Prenatal Vit-Fe Fumarate-FA (MULTIVITAMIN-PRENATAL) 27-0.8 MG TABS tablet Take 1 tablet by mouth daily at 12 noon.  . triamcinolone ointment (KENALOG) 0.5 % Apply 1 application topically 2 (two) times daily. (Patient taking differently: Apply 1 application topically 2 (two) times daily as needed. )  . [DISCONTINUED] cephALEXin (KEFLEX) 500 MG capsule Take 1 capsule (500 mg total) by mouth 2 (two) times daily.  . [DISCONTINUED] metroNIDAZOLE (METROGEL VAGINAL) 0.75 % vaginal gel Place 1 Applicatorful vaginally at bedtime.   No  facility-administered encounter medications on file as of 11/10/2020.    Allergies as of 11/10/2020  . (No Known Allergies)    Past Medical History:  Diagnosis Date  . Anemia    IDA currently well controlled  . Medical history non-contributory   . PONV (postoperative nausea and vomiting)    sick after wisdom teeth    Past Surgical History:  Procedure Laterality Date  . TONSILLECTOMY AND ADENOIDECTOMY Bilateral 06/17/2020   Procedure: TONSILLECTOMY AND ADENOIDECTOMY;  Surgeon: Newman Pies, MD;  Location: [facility];  Service: ENT;  Laterality: Bilateral;  . WISDOM TOOTH EXTRACTION  2019    Family History  Problem Relation Age of Onset  . Diabetes Maternal Grandfather   . Breast cancer Maternal Aunt   . Colon cancer Neg Hx   . Stomach cancer Neg Hx   . Esophageal cancer Neg Hx     Social History   Socioeconomic History  . Marital status: Single    Spouse name: Not on file  . Number of children: Not on file  . Years of education: Not on file  . Highest education level: Not on file  Occupational History  . Not on file  Tobacco Use  . Smoking status: Never Smoker  . Smokeless tobacco: Never Used  Vaping Use  . Vaping Use: Never used  Substance and Sexual Activity  . Alcohol use: Never  . Drug use: Never  . Sexual activity: Not on file  Other Topics Concern  . Not on file  Social History Narrative  . Not on file  Social Determinants of Health   Financial Resource Strain:   . Difficulty of Paying Living Expenses: Not on file  Food Insecurity:   . Worried About Programme researcher, broadcasting/film/video in the Last Year: Not on file  . Ran Out of Food in the Last Year: Not on file  Transportation Needs:   . Lack of Transportation (Medical): Not on file  . Lack of Transportation (Non-Medical): Not on file  Physical Activity:   . Days of Exercise per Week: Not on file  . Minutes of Exercise per Session: Not on file  Stress:   . Feeling of Stress : Not on file  Social  Connections:   . Frequency of Communication with Friends and Family: Not on file  . Frequency of Social Gatherings with Friends and Family: Not on file  . Attends Religious Services: Not on file  . Active Member of Clubs or Organizations: Not on file  . Attends Banker Meetings: Not on file  . Marital Status: Not on file  Intimate Partner Violence:   . Fear of Current or Ex-Partner: Not on file  . Emotionally Abused: Not on file  . Physically Abused: Not on file  . Sexually Abused: Not on file      Review of systems: All other review of systems negative except as mentioned in the HPI.   Physical Exam: Vitals:   11/10/20 1437  BP: 96/60  Pulse: 89  SpO2: 98%   Body mass index is 30.8 kg/m. Gen:      No acute distress HEENT:  sclera anicteric Abd:      soft, non-tender; no palpable masses, no distension Ext:    No edema Neuro: alert and oriented x 3 Psych: normal mood and affect  Data Reviewed:  Reviewed labs, radiology imaging, old records and pertinent past GI work up   Assessment and Plan/Recommendations:  19 year old very pleasant female here with complaints of worsening constipation, recent change in bowel habits associated with abdominal bloating, cramping and intermittent nausea  Will need to exclude large polypoid lesion, neoplasia or partial bowel obstruction due to possible Crohn's disease or IBD given recent change in bowel habits and progressive worsening of symptoms  We will plan to proceed with colonoscopy for further evaluation The risks and benefits as well as alternatives of endoscopic procedure(s) have been discussed and reviewed. All questions answered. The patient agrees to proceed.  Continue MiraLAX with increased water intake We will plan for extended bowel prep with 2 days MiraLAX  The patient was provided an opportunity to ask questions and all were answered. The patient agreed with the plan and demonstrated an understanding of  the instructions.  Iona Beard , MD    CC: Armandina Stammer, MD

## 2020-11-12 ENCOUNTER — Encounter: Payer: Self-pay | Admitting: Gastroenterology

## 2020-11-25 ENCOUNTER — Other Ambulatory Visit: Payer: Self-pay

## 2020-11-25 ENCOUNTER — Encounter: Payer: Self-pay | Admitting: Gastroenterology

## 2020-11-25 ENCOUNTER — Ambulatory Visit (AMBULATORY_SURGERY_CENTER): Payer: Medicaid Other | Admitting: Gastroenterology

## 2020-11-25 VITALS — BP 104/61 | HR 59 | Temp 98.0°F | Resp 25 | Ht 61.0 in | Wt 163.0 lb

## 2020-11-25 DIAGNOSIS — R194 Change in bowel habit: Secondary | ICD-10-CM | POA: Diagnosis not present

## 2020-11-25 DIAGNOSIS — K5900 Constipation, unspecified: Secondary | ICD-10-CM | POA: Diagnosis not present

## 2020-11-25 DIAGNOSIS — K648 Other hemorrhoids: Secondary | ICD-10-CM | POA: Diagnosis not present

## 2020-11-25 MED ORDER — SODIUM CHLORIDE 0.9 % IV SOLN: 500.0000 mL | Freq: Once | INTRAVENOUS | Status: DC

## 2020-11-25 NOTE — Progress Notes (Signed)
pt tolerated well. VSS. awake and to recovery. Report given to RN.  

## 2020-11-25 NOTE — Patient Instructions (Signed)
Thank you for letting us take care of your healthcare needs today.   YOU HAD AN ENDOSCOPIC PROCEDURE TODAY AT THE [facility]:   Refer to the procedure report that was given to you for any specific questions about what was found during the examination.  If the procedure report does not answer your questions, please call your gastroenterologist to clarify.  If you requested that your care partner not be given the details of your procedure findings, then the procedure report has been included in a sealed envelope for you to review at your convenience later.  YOU SHOULD EXPECT: Some feelings of bloating in the abdomen. Passage of more gas than usual.  Walking can help get rid of the air that was put into your GI tract during the procedure and reduce the bloating. If you had a lower endoscopy (such as a colonoscopy or flexible sigmoidoscopy) you may notice spotting of blood in your stool or on the toilet paper. If you underwent a bowel prep for your procedure, you may not have a normal bowel movement for a few days.  Please Note:  You might notice some irritation and congestion in your nose or some drainage.  This is from the oxygen used during your procedure.  There is no need for concern and it should clear up in a day or so.  SYMPTOMS TO REPORT IMMEDIATELY:  Following lower endoscopy (colonoscopy or flexible sigmoidoscopy):  Excessive amounts of blood in the stool  Significant tenderness or worsening of abdominal pains  Swelling of the abdomen that is new, acute  Fever of 100F or higher  For urgent or emergent issues, a gastroenterologist can be reached at any hour by calling [phone]. Do not use MyChart messaging for urgent concerns.    DIET:  We do recommend a small meal at first, but then you may proceed to your regular diet.  Drink plenty of fluids but you should avoid alcoholic beverages for 24 hours.  ACTIVITY:  You should plan to take it easy for the rest of today and  you should NOT DRIVE or use heavy machinery until tomorrow (because of the sedation medicines used during the test).    FOLLOW UP: Our staff will call the number listed on your records 48-72 hours following your procedure to check on you and address any questions or concerns that you may have regarding the information given to you following your procedure. If we do not reach you, we will leave a message.  We will attempt to reach you two times.  During this call, we will ask if you have developed any symptoms of COVID 19. If you develop any symptoms (ie: fever, flu-like symptoms, shortness of breath, cough etc.) before then, please call [phone].  If you test positive for Covid 19 in the 2 weeks post procedure, please call and report this information to us.    If any biopsies were taken you will be contacted by phone or by letter within the next 1-3 weeks.  Please call us at [phone] if you have not heard about the biopsies in 3 weeks.    SIGNATURES/CONFIDENTIALITY: You and/or your care partner have signed paperwork which will be entered into your electronic medical record.  These signatures attest to the fact that that the information above on your After Visit Summary has been reviewed and is understood.  Full responsibility of the confidentiality of this discharge information lies with you and/or your care-partner.  

## 2020-11-25 NOTE — Op Note (Addendum)
[facility] Patient Name: Janet Rojas Procedure Date: 11/25/2020 7:54 AM MRN: [id number] Endoscopist: Napoleon Form , MD Age: 19 Referring MD:  Date of Birth: 11-11-2001 Gender: Female Account #: [id number] Procedure:                Colonoscopy Indications:              Abdominal pain, Change in bowel habits, Change in                            stool caliber, Constipation Medicines:                Monitored Anesthesia Care Procedure:                Pre-Anesthesia Assessment:                           - Prior to the procedure, a History and Physical                            was performed, and patient medications and                            allergies were reviewed. The patient's tolerance of                            previous anesthesia was also reviewed. The risks                            and benefits of the procedure and the sedation                            options and risks were discussed with the patient.                            All questions were answered, and informed consent                            was obtained. Prior Anticoagulants: The patient has                            taken no previous anticoagulant or antiplatelet                            agents. ASA Grade Assessment: II - A patient with                            mild systemic disease. After reviewing the risks                            and benefits, the patient was deemed in                            satisfactory condition to undergo the procedure.  After obtaining informed consent, the colonoscope                            was passed under direct vision. Throughout the                            procedure, the patient's blood pressure, pulse, and                            oxygen saturations were monitored continuously. The                            Colonoscope was introduced through the anus and                            advanced to the the terminal  ileum, with                            identification of the appendiceal orifice and IC                            valve. The colonoscopy was performed without                            difficulty. The patient tolerated the procedure                            well. The quality of the bowel preparation was                            excellent. The terminal ileum, ileocecal valve,                            appendiceal orifice, and rectum were photographed. Scope In: 8:05:09 AM Scope Out: 8:18:17 AM Scope Withdrawal Time: 0 hours 9 minutes 29 seconds  Total Procedure Duration: 0 hours 13 minutes 8 seconds  Findings:                 The perianal and digital rectal examinations were                            normal.                           Non-bleeding internal hemorrhoids were found during                            retroflexion. The hemorrhoids were small.                           The exam was otherwise without abnormality.                           The terminal ileum appeared normal. Complications:            No immediate complications. Estimated  Blood Loss:     Estimated blood loss: none. Impression:               - Non-bleeding internal hemorrhoids.                           - The examination was otherwise normal.                           - The examined portion of the ileum was normal.                           - No specimens collected. Recommendation:           - Patient has a contact number available for                            emergencies. The signs and symptoms of potential                            delayed complications were discussed with the                            patient. Return to normal activities tomorrow.                            Written discharge instructions were provided to the                            patient.                           - Resume previous diet.                           - Continue present medications.                           -  Continue daily fiber with meals 2-3 times daily                            and Miralax 1 capful daily as needed                           - Repeat colonoscopy at age 5 for screening                            purposes. Napoleon Form, MD 11/25/2020 8:25:07 AM This report has been signed electronically.

## 2020-11-25 NOTE — Progress Notes (Signed)
Vitals by CW 

## 2020-11-25 NOTE — Progress Notes (Signed)
Late entry: Lidocaine 2% 74ml IV given as per Dr. Lavon Paganini.

## 2020-11-27 ENCOUNTER — Inpatient Hospital Stay: Payer: Medicaid Other | Admitting: Hematology

## 2020-11-27 ENCOUNTER — Inpatient Hospital Stay: Payer: Medicaid Other | Attending: Pediatrics

## 2020-11-27 ENCOUNTER — Telehealth: Payer: Self-pay

## 2020-11-27 NOTE — Telephone Encounter (Signed)
  Follow up Call-  Call back number 11/25/2020  Post procedure Call Back phone  # [phone]  Permission to leave phone message Yes  Some recent data might be hidden     Patient questions:  Do you have a fever, pain , or abdominal swelling? No. Pain Score  0 *  Have you tolerated food without any problems? Yes.    Have you been able to return to your normal activities? Yes.    Do you have any questions about your discharge instructions: Diet   No. Medications  No. Follow up visit  No.  Do you have questions or concerns about your Care? No.  Actions: * If pain score is 4 or above: 1. No action needed, pain <4.Have you developed a fever since your procedure? no  2.   Have you had an respiratory symptoms (SOB or cough) since your procedure? no  3.   Have you tested positive for COVID 19 since your procedure no  4.   Have you had any family members/close contacts diagnosed with the COVID 19 since your procedure?  no   If yes to any of these questions please route to Laverna Peace, RN and Karlton Lemon, RN

## 2021-05-20 ENCOUNTER — Ambulatory Visit
Admission: RE | Admit: 2021-05-20 | Discharge: 2021-05-20 | Disposition: A | Discharge status: Home / Self Care | Payer: Medicaid Other | Source: Ambulatory Visit | Attending: Nurse Practitioner | Admitting: Nurse Practitioner

## 2021-05-20 ENCOUNTER — Other Ambulatory Visit: Payer: Self-pay | Admitting: Nurse Practitioner

## 2021-05-20 DIAGNOSIS — R059 Cough, unspecified: Secondary | ICD-10-CM

## 2021-06-21 ENCOUNTER — Other Ambulatory Visit: Payer: Self-pay

## 2021-06-21 ENCOUNTER — Ambulatory Visit
Admission: EM | Admit: 2021-06-21 | Discharge: 2021-06-21 | Disposition: A | Discharge status: Home / Self Care | Payer: Medicaid Other | Attending: Emergency Medicine | Admitting: Emergency Medicine

## 2021-06-21 ENCOUNTER — Encounter: Payer: Self-pay | Admitting: Emergency Medicine

## 2021-06-21 DIAGNOSIS — R058 Other specified cough: Secondary | ICD-10-CM

## 2021-06-21 MED ORDER — PREDNISONE 50 MG PO TABS: 50.0000 mg | ORAL_TABLET | Freq: Every day | ORAL | 0 refills | Status: AC

## 2021-06-21 MED ORDER — GUAIFENESIN-CODEINE 100-10 MG/5ML PO SOLN: 5.0000 mL | Freq: Every evening | ORAL | 0 refills | Status: DC | PRN

## 2021-06-21 MED ORDER — BENZONATATE 200 MG PO CAPS: 200.0000 mg | ORAL_CAPSULE | Freq: Three times a day (TID) | ORAL | 0 refills | Status: AC | PRN

## 2021-06-21 MED ORDER — DM-GUAIFENESIN ER 30-600 MG PO TB12: 1.0000 | ORAL_TABLET | Freq: Two times a day (BID) | ORAL | 0 refills | Status: DC

## 2021-06-21 NOTE — ED Triage Notes (Signed)
Pt sts cough and chest congestion x several weeks; pt sts had taken pred and a round of antibiotics without relief

## 2021-06-21 NOTE — Discharge Instructions (Addendum)
Begin prednisone 50 mg daily for 5 days-take with food and earlier in the day if possible Tessalon/benzonatate every 8 hours for cough during the day, supplement with Mucinex DM twice daily Use Robitussin with codeine at bedtime-will cause drowsiness, do not drive or work while taking, do not use with Mucinex Contact primary care for update on pulmonology referral Please follow-up if any symptoms not improving or worsening

## 2021-06-21 NOTE — ED Provider Notes (Signed)
[name]    CSN: [id number] Arrival date & time: 06/21/21  1434      History   Chief Complaint Chief Complaint  Patient presents with   Cough    HPI Janet Rojas is a 20 y.o. female history of vitamin D deficiency presenting today for evaluation of cough and congestion.  Reports cough and congestion for approximately 4 months.  Reports initially being sick in March 2022.  Initially thought to be viral, but symptoms have lingered.  Was seen by her primary care and initially placed on course of Augmentin x7 days, completed this, symptoms did not improve, was seen approximately 1 month ago and had chest x-ray which was reported to be normal.  Also took 2-week course of doxycycline and 2-week course of prednisone-through discussion possibly may have been a taper of 40-30-20-10.  Symptoms did improve on the prednisone, but reports over the past week has become thicker mucus and increased symptoms again.  Denies fevers.  Is awaiting referral to pulmonologist from primary care.  Denies history of asthma, tobacco use, vaping.  She does report secondhand smoke exposure and possible mold exposure in the house.  HPI  Past Medical History:  Diagnosis Date   Allergy    Anemia    IDA currently well controlled   Medical history non-contributory    PONV (postoperative nausea and vomiting)    sick after wisdom teeth    Patient Active Problem List   Diagnosis Date Noted   Iron deficiency 11/29/2019   Dietary iron deficiency without anemia 10/18/2018   Vitamin D deficiency 10/18/2018   Allergic rhinitis 10/18/2018    Past Surgical History:  Procedure Laterality Date   TONSILLECTOMY AND ADENOIDECTOMY Bilateral 06/17/2020   Procedure: TONSILLECTOMY AND ADENOIDECTOMY;  Surgeon: Newman Pies, MD;  Location: [facility];  Service: ENT;  Laterality: Bilateral;   WISDOM TOOTH EXTRACTION  2019    OB History   No obstetric history on file.      Home Medications     Prior to Admission medications   Medication Sig Start Date End Date Taking? Authorizing Provider  benzonatate (TESSALON) 200 MG capsule Take 1 capsule (200 mg total) by mouth 3 (three) times daily as needed for up to 7 days for cough (daytime). 06/21/21 06/28/21 Yes Pauleen Goleman C, PA-C  dextromethorphan-guaiFENesin (MUCINEX DM) 30-600 MG 12hr tablet Take 1 tablet by mouth 2 (two) times daily. 06/21/21  Yes Antonin Meininger C, PA-C  guaiFENesin-codeine 100-10 MG/5ML syrup Take 5-10 mLs by mouth at bedtime as needed for cough. 06/21/21  Yes Myeesha Shane C, PA-C  predniSONE (DELTASONE) 50 MG tablet Take 1 tablet (50 mg total) by mouth daily with breakfast for 5 days. 06/21/21 06/26/21 Yes Dhruvan Gullion C, PA-C  cholecalciferol (VITAMIN D3) 25 MCG (1000 UNIT) tablet Take 2,000 Units by mouth daily.    [provider]  loratadine-pseudoephedrine (CLARITIN-D 12-HOUR) 5-120 MG tablet Take 1 tablet by mouth as needed for allergies.    [provider]  Prenatal Vit-Fe Fumarate-FA (MULTIVITAMIN-PRENATAL) 27-0.8 MG TABS tablet Take 1 tablet by mouth daily at 12 noon.    [provider]  triamcinolone ointment (KENALOG) 0.5 % Apply 1 application topically 2 (two) times daily. Patient taking differently: Apply 1 application topically 2 (two) times daily as needed.  10/03/20   Hall-Potvin, [place], PA-C    Family History Family History  Problem Relation Age of Onset   Diabetes Maternal Grandfather    Breast cancer Maternal Aunt  Colon cancer Neg Hx    Stomach cancer Neg Hx    Esophageal cancer Neg Hx     Social History Social History   Tobacco Use   Smoking status: Never   Smokeless tobacco: Never  Vaping Use   Vaping Use: Never used  Substance Use Topics   Alcohol use: Never   Drug use: Never     Allergies   Patient has no known allergies.   Review of Systems Review of Systems  Constitutional:  Negative for activity change, appetite change, chills,  fatigue and fever.  HENT:  Positive for congestion. Negative for ear pain, rhinorrhea, sinus pressure, sore throat and trouble swallowing.   Eyes:  Negative for discharge and redness.  Respiratory:  Positive for cough. Negative for chest tightness and shortness of breath.   Cardiovascular:  Negative for chest pain.  Gastrointestinal:  Negative for abdominal pain, diarrhea, nausea and vomiting.  Musculoskeletal:  Negative for myalgias.  Skin:  Negative for rash.  Neurological:  Negative for dizziness, light-headedness and headaches.    Physical Exam Triage Vital Signs ED Triage Vitals  Enc Vitals Group     BP      Pulse      Resp      Temp      Temp src      SpO2      Weight      Height      Head Circumference      Peak Flow      Pain Score      Pain Loc      Pain Edu?      Excl. in GC?    No data found.  Updated Vital Signs BP (!) 160/74 (BP Location: Left Arm)   Pulse 98   Temp 98.1 F (36.7 C) (Oral)   Resp 18   SpO2 99%   Visual Acuity Right Eye Distance:   Left Eye Distance:   Bilateral Distance:    Right Eye Near:   Left Eye Near:    Bilateral Near:     Physical Exam Vitals and nursing note reviewed.  Constitutional:      Appearance: She is well-developed.     Comments: No acute distress  HENT:     Head: Normocephalic and atraumatic.     Ears:     Comments: Bilateral ears without tenderness to palpation of external auricle, tragus and mastoid, EAC's without erythema or swelling, TM's with good bony landmarks and cone of light. Non erythematous.      Nose: Nose normal.     Mouth/Throat:     Comments: Oral mucosa pink and moist, no tonsillar enlargement or exudate. Posterior pharynx patent and nonerythematous, no uvula deviation or swelling. Normal phonation.  Eyes:     Conjunctiva/sclera: Conjunctivae normal.  Cardiovascular:     Rate and Rhythm: Normal rate and regular rhythm.  Pulmonary:     Effort: Pulmonary effort is normal. No respiratory  distress.     Comments: Breathing comfortably at rest, CTABL, no wheezing, rales or other adventitious sounds auscultated  Abdominal:     General: There is no distension.  Musculoskeletal:        General: Normal range of motion.     Cervical back: Neck supple.  Skin:    General: Skin is warm and dry.  Neurological:     Mental Status: She is alert and oriented to person, place, and time.     UC Treatments / Results  Labs (all  labs ordered are listed, but only abnormal results are displayed) Labs Reviewed - No data to display  EKG   Radiology No results found.  Procedures Procedures (including critical care time)  Medications Ordered in UC Medications - No data to display  Initial Impression / Assessment and Plan / UC Course  I have reviewed the triage vital signs and the nursing notes.  Pertinent labs & imaging results that were available during my care of the patient were reviewed by me and considered in my medical decision making (see chart for details).     Suspect cough likely postviral, has been on 2 rounds of antibiotics, deferring further antibiotic therapy.  Will retry 5-day course of prednisone at 50 mg consistently instead of taper, supplementing with Tessalon and Mucinex during the day, encourage daily antihistamine to help with postnasal drainage, Robitussin-AC at bedtime for nighttime cough.  Advised to continue to follow-up with primary care and check on pulmonology referral given persistent cough.  Deferring further chest x-ray today given recent x-ray within the past month.  Unable to review with my chart, primary care is [facility].  Discussed strict return precautions. Patient verbalized understanding and is agreeable with plan.  Final Clinical Impressions(s) / UC Diagnoses   Final diagnoses:  Post-viral cough syndrome     Discharge Instructions      Begin prednisone 50 mg daily for 5 days-take with food and earlier in the day if  possible Tessalon/benzonatate every 8 hours for cough during the day, supplement with Mucinex DM twice daily Use Robitussin with codeine at bedtime-will cause drowsiness, do not drive or work while taking, do not use with Mucinex Contact primary care for update on pulmonology referral Please follow-up if any symptoms not improving or worsening     ED Prescriptions     Medication Sig Dispense Auth. Provider   predniSONE (DELTASONE) 50 MG tablet Take 1 tablet (50 mg total) by mouth daily with breakfast for 5 days. 5 tablet Brigett Estell C, PA-C   benzonatate (TESSALON) 200 MG capsule Take 1 capsule (200 mg total) by mouth 3 (three) times daily as needed for up to 7 days for cough (daytime). 28 capsule Rayanne Padmanabhan C, PA-C   guaiFENesin-codeine 100-10 MG/5ML syrup Take 5-10 mLs by mouth at bedtime as needed for cough. 120 mL Burak Zerbe C, PA-C   dextromethorphan-guaiFENesin (MUCINEX DM) 30-600 MG 12hr tablet Take 1 tablet by mouth 2 (two) times daily. 14 tablet Titianna Loomis, [place] C, PA-C      I have reviewed the PDMP during this encounter.   Sharyon Cable Nowata C, PA-C 06/21/21 1520

## 2021-06-22 ENCOUNTER — Ambulatory Visit: Payer: Self-pay

## 2021-07-29 NOTE — Progress Notes (Signed)
Synopsis: Referred for cough and congestion by Lance Bosch, NP  Subjective:   PATIENT ID: Janet Rojas GENDER: female DOB: 2001-01-01, MRN: [id number]  Chief Complaint  Patient presents with   Consult    Patient feels that she has thick-green-clear sputum, started in march. Prednisone, amoxicillin, doxycycline, mucinex, Claritin no relief from any.     Since about March she has had very productive cough, nearly throughout the day. Had covid-19 last month (was vaccinated last summer with pfizer). She has has periodic sinonasal congestion but not throughout the last 6 months. No hemoptysis, fever, weight loss, night sweats. Occasional dyspnea. Went to urgent care a couple months ago when she developed chest pain which has since resolved. Two courses of prednisone, 1 course of augmentin, doxycycline. Nothing has seemed to help.   Never had asthma as a kid but her sister did. Father smokes. Moldy house.   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 Has always lived in [place]. Has 3 cats at home, no birds.   Otherwise pertinent review of systems is negative.  Past Medical History:  Diagnosis Date   Allergy    Anemia    IDA currently well controlled   Medical history non-contributory    PONV (postoperative nausea and vomiting)    sick after wisdom teeth     Family History  Problem Relation Age of Onset   Diabetes Maternal Grandfather    Breast cancer Maternal Aunt    Colon cancer Neg Hx    Stomach cancer Neg Hx    Esophageal cancer Neg Hx      Past Surgical History:  Procedure Laterality Date   TONSILLECTOMY AND ADENOIDECTOMY Bilateral 06/17/2020   Procedure: TONSILLECTOMY AND ADENOIDECTOMY;  Surgeon: Newman Pies, MD;  Location: [facility];  Service: ENT;  Laterality: Bilateral;   WISDOM TOOTH EXTRACTION  2019    Social History   Socioeconomic History   Marital status: Single    Spouse name: Not on file   Number of children: Not on file   Years of  education: Not on file   Highest education level: Not on file  Occupational History   Not on file  Tobacco Use   Smoking status: Never   Smokeless tobacco: Never  Vaping Use   Vaping Use: Never used  Substance and Sexual Activity   Alcohol use: Never   Drug use: Never   Sexual activity: Not on file  Other Topics Concern   Not on file  Social History Narrative   Not on file   Social Determinants of Health   Financial Resource Strain: Not on file  Food Insecurity: Not on file  Transportation Needs: Not on file  Physical Activity: Not on file  Stress: Not on file  Social Connections: Not on file  Intimate Partner Violence: Not on file     No Known Allergies   Outpatient Medications Prior to Visit  Medication Sig Dispense Refill   cholecalciferol (VITAMIN D3) 25 MCG (1000 UNIT) tablet Take 2,000 Units by mouth daily.     dextromethorphan-guaiFENesin (MUCINEX DM) 30-600 MG 12hr tablet Take 1 tablet by mouth 2 (two) times daily. 14 tablet 0   loratadine-pseudoephedrine (CLARITIN-D 12-HOUR) 5-120 MG tablet Take 1 tablet by mouth as needed for allergies.     phentermine 15 MG capsule Take 15 mg by mouth daily.     Prenatal Vit-Fe Fumarate-FA (MULTIVITAMIN-PRENATAL) 27-0.8 MG TABS tablet Take 1 tablet by mouth daily at 12 noon.  triamcinolone ointment (KENALOG) 0.5 % Apply 1 application topically 2 (two) times daily. (Patient taking differently: Apply 1 application topically 2 (two) times daily as needed.) 30 g 0   amoxicillin-clavulanate (AUGMENTIN) 875-125 MG tablet Take 1 tablet by mouth 2 (two) times daily.     doxycycline (VIBRAMYCIN) 100 MG capsule Take 100 mg by mouth 2 (two) times daily.     guaiFENesin-codeine 100-10 MG/5ML syrup Take 5-10 mLs by mouth at bedtime as needed for cough. (Patient not taking: Reported on 07/30/2021) 120 mL 0   predniSONE (DELTASONE) 20 MG tablet Take by mouth. (Patient not taking: Reported on 07/30/2021)     No facility-administered medications  prior to visit.       Objective:   Physical Exam:  General appearance: 20 y.o., female, NAD, conversant  Eyes: anicteric sclerae, moist conjunctivae; no lid-lag; PERRL, tracking appropriately HENT: NCAT; oropharynx, MMM, no mucosal ulcerations; normal hard and soft palate Neck: Trachea midline; no lymphadenopathy, no JVD Lungs: CTAB, no crackles, no wheeze, with normal respiratory effort CV: RRR, no MRGs  Abdomen: Soft, non-tender; non-distended, BS present  Extremities: No peripheral edema, radial and DP pulses present bilaterally  Skin: Normal temperature, turgor and texture; no rash Psych: Appropriate affect Neuro: Alert and oriented to person and place, no focal deficit    Vitals:   07/30/21 1430  BP: 100/60  Pulse: 95  SpO2: 95%  Weight: 160 lb (72.6 kg)  Height: 5\' 1"  (1.549 m)   95% on RA BMI Readings from Last 3 Encounters:  07/30/21 30.23 kg/m  11/25/20 30.80 kg/m  11/10/20 30.80 kg/m   Wt Readings from Last 3 Encounters:  07/30/21 160 lb (72.6 kg)  11/25/20 163 lb (73.9 kg)  11/10/20 163 lb (73.9 kg)     CBC    Component Value Date/Time   WBC 8.5 06/05/2020 1454   RBC 4.80 06/05/2020 1454   HGB 14.5 06/05/2020 1454   HCT 42.3 06/05/2020 1454   PLT 257 06/05/2020 1454   MCV 88.1 06/05/2020 1454   MCH 30.2 06/05/2020 1454   MCHC 34.3 06/05/2020 1454   RDW 12.4 06/05/2020 1454   LYMPHSABS 2.5 06/05/2020 1454   MONOABS 0.8 06/05/2020 1454   EOSABS 0.2 06/05/2020 1454   BASOSABS 0.1 06/05/2020 1454     Chest Imaging: CXR 05/20/21 reviewed by me and unremarkable  Pulmonary Functions Testing Results: No flowsheet data found.  FeNO: None  Pathology: None  Echocardiogram: None  Heart Catheterization: None    Assessment & Plan:   # Chronic cough: copious sputum production (per history would fill a cup throughout the day) raises concern for bronchiectasis or chronic bronchitis. May alternatively have simply had bad luck with back to back  to back viral infections. Nasal mucosa seems to be edematous on exam although she endorses little in way of sinonasal congestion currently.   Plan: - spiro pre/post - CT Chest - AFB, sputum cultures - if CT, PFTs unrevealing then can discuss therapeutic trial of flonase, nasal irrigation, consideration of bronchoprovocation - trial of albuterol 2 puffs BID followed by flutter valve 10 slow but firm puffs BID for airway clearance     05/22/21, MD [facility] 07/30/2021 2:34 PM

## 2021-07-30 ENCOUNTER — Ambulatory Visit (INDEPENDENT_AMBULATORY_CARE_PROVIDER_SITE_OTHER): Payer: Medicaid Other | Admitting: Student

## 2021-07-30 ENCOUNTER — Other Ambulatory Visit: Payer: Self-pay

## 2021-07-30 ENCOUNTER — Encounter: Payer: Self-pay | Admitting: Student

## 2021-07-30 VITALS — BP 100/60 | HR 95 | Ht 61.0 in | Wt 160.0 lb

## 2021-07-30 DIAGNOSIS — J42 Unspecified chronic bronchitis: Secondary | ICD-10-CM

## 2021-07-30 DIAGNOSIS — R053 Chronic cough: Secondary | ICD-10-CM | POA: Diagnosis not present

## 2021-07-30 MED ORDER — ALBUTEROL SULFATE HFA 108 (90 BASE) MCG/ACT IN AERS: 2.0000 | INHALATION_SPRAY | Freq: Four times a day (QID) | RESPIRATORY_TRACT | 6 refills | Status: DC | PRN

## 2021-07-30 NOTE — Addendum Note (Signed)
Addended by: Wyvonne Lenz on: 07/30/2021 03:31 PM   Modules accepted: Orders

## 2021-07-30 NOTE — Patient Instructions (Addendum)
-   Albuterol inhaler 2 puffs twice daily, followed by flutter valve as below: - 10 slow but firm puffs through flutter valve twice daily after albuterol to cough all that junk out - Sputum cultures today - CT Chest, breathing tests ordered. Make sure to stop your albuterol for 2 days before your breathing tests. - See you in 3 months!

## 2021-08-04 ENCOUNTER — Other Ambulatory Visit: Payer: Medicaid Other

## 2021-08-04 DIAGNOSIS — J42 Unspecified chronic bronchitis: Secondary | ICD-10-CM

## 2021-08-05 ENCOUNTER — Telehealth: Payer: Self-pay | Admitting: Student

## 2021-08-05 DIAGNOSIS — J42 Unspecified chronic bronchitis: Secondary | ICD-10-CM

## 2021-08-05 LAB — RESPIRATORY CULTURE OR RESPIRATORY AND SPUTUM CULTURE: MICRO NUMBER:: 12219406

## 2021-08-05 NOTE — Telephone Encounter (Signed)
Called and discussed sputum sample. We will try again. SHe'll try to collect a sample first thing in the morning and then bring it in within 24h of creating the sample. New order placed for sputum culture.

## 2021-08-08 ENCOUNTER — Other Ambulatory Visit: Payer: Self-pay

## 2021-08-08 ENCOUNTER — Ambulatory Visit
Admission: RE | Admit: 2021-08-08 | Discharge: 2021-08-08 | Disposition: A | Discharge status: Home / Self Care | Payer: Medicaid Other | Source: Ambulatory Visit | Attending: Student | Admitting: Student

## 2021-08-08 DIAGNOSIS — R053 Chronic cough: Secondary | ICD-10-CM

## 2021-08-08 DIAGNOSIS — J42 Unspecified chronic bronchitis: Secondary | ICD-10-CM

## 2021-08-12 ENCOUNTER — Telehealth: Payer: Self-pay | Admitting: Student

## 2021-08-12 DIAGNOSIS — R9389 Abnormal findings on diagnostic imaging of other specified body structures: Secondary | ICD-10-CM

## 2021-08-12 DIAGNOSIS — K769 Liver disease, unspecified: Secondary | ICD-10-CM

## 2021-08-12 NOTE — Telephone Encounter (Signed)
Pt is returning phone call from Dr. Thora Lance in regards to CT results. Pls regard; [phone]

## 2021-08-12 NOTE — Telephone Encounter (Signed)
Called and left voicemail to discuss CT Chest results. No lung abnormality, airways look a-ok including no evidence of bronchiectasis. There was incidental hypodensity over right lobe of liver with radiology recommendation for follow up CT abdomen with contrast which I've ordered. Also wanted to confirm that she has PCP who can follow up on these results.   [facility]

## 2021-08-12 NOTE — Telephone Encounter (Signed)
Discussed results including incidental hypodensity over liver. CT abdomen w contrast ordered, she has PCP and will discuss results with them.

## 2021-09-11 ENCOUNTER — Ambulatory Visit
Admission: RE | Admit: 2021-09-11 | Discharge: 2021-09-11 | Disposition: A | Discharge status: Home / Self Care | Payer: Medicaid Other | Source: Ambulatory Visit | Attending: Student | Admitting: Student

## 2021-09-11 ENCOUNTER — Other Ambulatory Visit: Payer: Self-pay

## 2021-09-11 DIAGNOSIS — R9389 Abnormal findings on diagnostic imaging of other specified body structures: Secondary | ICD-10-CM

## 2021-09-11 DIAGNOSIS — K769 Liver disease, unspecified: Secondary | ICD-10-CM

## 2021-09-11 MED ORDER — IOPAMIDOL (ISOVUE-300) INJECTION 61%
100.0000 mL | Freq: Once | INTRAVENOUS | Status: AC | PRN
  Administered 2021-09-11: 100 mL via INTRAVENOUS

## 2021-09-25 LAB — AFB CULTURE WITH SMEAR (NOT AT ARMC)
Acid Fast Culture: NEGATIVE
Acid Fast Smear: NEGATIVE

## 2022-03-04 ENCOUNTER — Emergency Department (HOSPITAL_COMMUNITY): Payer: Medicaid Other

## 2022-03-04 ENCOUNTER — Encounter: Payer: Self-pay | Admitting: Emergency Medicine

## 2022-03-04 ENCOUNTER — Ambulatory Visit
Admission: EM | Admit: 2022-03-04 | Discharge: 2022-03-04 | Disposition: A | Discharge status: Home / Self Care | Payer: Medicaid Other

## 2022-03-04 ENCOUNTER — Other Ambulatory Visit: Payer: Self-pay

## 2022-03-04 ENCOUNTER — Encounter (HOSPITAL_COMMUNITY): Payer: Self-pay | Admitting: Emergency Medicine

## 2022-03-04 ENCOUNTER — Emergency Department (HOSPITAL_COMMUNITY)
Admission: EM | Admit: 2022-03-04 | Discharge: 2022-03-04 | Disposition: A | Discharge status: Home / Self Care | Payer: Medicaid Other | Attending: Emergency Medicine | Admitting: Emergency Medicine

## 2022-03-04 DIAGNOSIS — R8271 Bacteriuria: Secondary | ICD-10-CM | POA: Diagnosis not present

## 2022-03-04 DIAGNOSIS — K529 Noninfective gastroenteritis and colitis, unspecified: Secondary | ICD-10-CM | POA: Diagnosis not present

## 2022-03-04 DIAGNOSIS — N9489 Other specified conditions associated with female genital organs and menstrual cycle: Secondary | ICD-10-CM | POA: Insufficient documentation

## 2022-03-04 DIAGNOSIS — R109 Unspecified abdominal pain: Secondary | ICD-10-CM | POA: Diagnosis present

## 2022-03-04 DIAGNOSIS — R1011 Right upper quadrant pain: Secondary | ICD-10-CM

## 2022-03-04 LAB — URINALYSIS, ROUTINE W REFLEX MICROSCOPIC
Bilirubin Urine: NEGATIVE
Glucose, UA: NEGATIVE mg/dL
Ketones, ur: NEGATIVE mg/dL
Leukocytes,Ua: NEGATIVE
Nitrite: NEGATIVE
Protein, ur: NEGATIVE mg/dL
Specific Gravity, Urine: 1.006 (ref 1.005–1.030)
pH: 7 (ref 5.0–8.0)

## 2022-03-04 LAB — CBC WITH DIFFERENTIAL/PLATELET
Abs Immature Granulocytes: 0.05 10*3/uL (ref 0.00–0.07)
Basophils Absolute: 0.1 10*3/uL (ref 0.0–0.1)
Basophils Relative: 1 %
Eosinophils Absolute: 0.2 10*3/uL (ref 0.0–0.5)
Eosinophils Relative: 2 %
HCT: 44.3 % (ref 36.0–46.0)
Hemoglobin: 15.3 g/dL — ABNORMAL HIGH (ref 12.0–15.0)
Immature Granulocytes: 1 %
Lymphocytes Relative: 27 %
Lymphs Abs: 2.5 10*3/uL (ref 0.7–4.0)
MCH: 30.4 pg (ref 26.0–34.0)
MCHC: 34.5 g/dL (ref 30.0–36.0)
MCV: 88.1 fL (ref 80.0–100.0)
Monocytes Absolute: 0.9 10*3/uL (ref 0.1–1.0)
Monocytes Relative: 9 %
Neutro Abs: 5.8 10*3/uL (ref 1.7–7.7)
Neutrophils Relative %: 60 %
Platelets: 263 10*3/uL (ref 150–400)
RBC: 5.03 MIL/uL (ref 3.87–5.11)
RDW: 13.1 % (ref 11.5–15.5)
WBC: 9.5 10*3/uL (ref 4.0–10.5)
nRBC: 0 % (ref 0.0–0.2)

## 2022-03-04 LAB — COMPREHENSIVE METABOLIC PANEL
ALT: 29 U/L (ref 0–44)
AST: 28 U/L (ref 15–41)
Albumin: 4.9 g/dL (ref 3.5–5.0)
Alkaline Phosphatase: 65 U/L (ref 38–126)
Anion gap: 8 (ref 5–15)
BUN: 10 mg/dL (ref 6–20)
CO2: 27 mmol/L (ref 22–32)
Calcium: 9.6 mg/dL (ref 8.9–10.3)
Chloride: 106 mmol/L (ref 98–111)
Creatinine, Ser: 0.72 mg/dL (ref 0.44–1.00)
GFR, Estimated: >60 mL/min (ref >60)
Glucose, Bld: 95 mg/dL (ref 70–99)
Potassium: 3.9 mmol/L (ref 3.5–5.1)
Sodium: 141 mmol/L (ref 135–145)
Total Bilirubin: 0.6 mg/dL (ref 0.3–1.2)
Total Protein: 8.5 g/dL — ABNORMAL HIGH (ref 6.5–8.1)

## 2022-03-04 LAB — I-STAT BETA HCG BLOOD, ED (MC, WL, AP ONLY): I-stat hCG, quantitative: <5 m[IU]/mL (ref <5)

## 2022-03-04 LAB — LIPASE, BLOOD: Lipase: 27 U/L (ref 11–51)

## 2022-03-04 MED ORDER — SODIUM CHLORIDE 0.9 % IV BOLUS
1000.0000 mL | Freq: Once | INTRAVENOUS | Status: AC
  Administered 2022-03-04: 21:00:00 1000 mL via INTRAVENOUS

## 2022-03-04 MED ORDER — METRONIDAZOLE 500 MG/100ML IV SOLN
500.0000 mg | Freq: Once | INTRAVENOUS | Status: AC
  Administered 2022-03-04: 21:00:00 500 mg via INTRAVENOUS
  Filled 2022-03-04: qty 100

## 2022-03-04 MED ORDER — IOHEXOL 300 MG/ML  SOLN
100.0000 mL | Freq: Once | INTRAMUSCULAR | Status: AC | PRN
  Administered 2022-03-04: 21:00:00 100 mL via INTRAVENOUS

## 2022-03-04 MED ORDER — DICYCLOMINE HCL 20 MG PO TABS: 20.0000 mg | ORAL_TABLET | Freq: Two times a day (BID) | ORAL | 0 refills | Status: DC

## 2022-03-04 MED ORDER — SODIUM CHLORIDE 0.9 % IV SOLN
2.0000 g | Freq: Once | INTRAVENOUS | Status: AC
  Administered 2022-03-04: 21:00:00 2 g via INTRAVENOUS
  Filled 2022-03-04: qty 20

## 2022-03-04 MED ORDER — CIPROFLOXACIN HCL 500 MG PO TABS: 500.0000 mg | ORAL_TABLET | Freq: Two times a day (BID) | ORAL | 0 refills | Status: DC

## 2022-03-04 MED ORDER — ONDANSETRON 4 MG PO TBDP: 4.0000 mg | ORAL_TABLET | Freq: Three times a day (TID) | ORAL | 0 refills | Status: DC | PRN

## 2022-03-04 MED ORDER — METRONIDAZOLE 500 MG PO TABS: 500.0000 mg | ORAL_TABLET | Freq: Two times a day (BID) | ORAL | 0 refills | Status: DC

## 2022-03-04 NOTE — ED Provider Notes (Signed)
Patient here today for severe abdominal pain that started yesterday. She has had nausea as well. She reports she has not had BM since yesterday despite taking 3 immodiums before pain started. She did have abnormal Abd CT about 6 months ago that had liver lesion mentioned with no apparent follow up. I recommended further evaluation in the ED given severity of pain and prior abnormal imaging as I feel she will likely need repeat CT or [place]. Patient is agreeable with plan. ?  ?Tomi Bamberger, PA-C ?03/04/22 1905 ? ?

## 2022-03-04 NOTE — Discharge Instructions (Signed)
Take the medication as prescribed.  Follow-up with [facility]. ? ?Return for new or worsening symptoms ?

## 2022-03-04 NOTE — ED Triage Notes (Signed)
Monday started with diarrhea. Yesterday took 3 imodiums, has had severe abdominal pain and nausea since. Has not had a bowel movement since yesterday.   ?

## 2022-03-04 NOTE — ED Provider Notes (Signed)
[facility] DEPT Provider Note   CSN: [id number] Arrival date & time: 03/04/22  1930    History  Chief Complaint  Patient presents with   Abdominal Pain    Janet Rojas is a 21 y.o. female with no significant past medical history here for evaluation of abdominal pain and diarrhea.  States on Sunday developed multiple episodes of watery diarrhea.  No recent antibiotics or travel.  Went to visit a family number who gave her Imodium yesterday.  Took 3 doses.  Subsequently became constipated has not had a bowel movement since.  Since then she has developed abdominal distention and pain to her right mid abdomen.  Does not radiate to her back.  No fever, emesis, dysuria, hematuria, no pelvic pain, vaginal discharge or concerns for any STDs.  No prior abdominal surgeries.  She has never had pain like this previously.  Pain is not worse with p.o. intake.  No meds PTA.  Seen at urgent care, recommend come here for further evaluation.  HPI     Home Medications Prior to Admission medications   Medication Sig Start Date End Date Taking? Authorizing Provider  ciprofloxacin (CIPRO) 500 MG tablet Take 1 tablet (500 mg total) by mouth every 12 (twelve) hours. 03/04/22  Yes Darby Fleeman A, PA-C  dicyclomine (BENTYL) 20 MG tablet Take 1 tablet (20 mg total) by mouth 2 (two) times daily. 03/04/22  Yes Quamir Willemsen A, PA-C  metroNIDAZOLE (FLAGYL) 500 MG tablet Take 1 tablet (500 mg total) by mouth 2 (two) times daily. 03/04/22  Yes Machaela Caterino A, PA-C  ondansetron (ZOFRAN-ODT) 4 MG disintegrating tablet Take 1 tablet (4 mg total) by mouth every 8 (eight) hours as needed for nausea or vomiting. 03/04/22  Yes Ruger Saxer A, PA-C  albuterol (VENTOLIN HFA) 108 (90 Base) MCG/ACT inhaler Inhale 2 puffs into the lungs every 6 (six) hours as needed for wheezing or shortness of breath. 07/30/21   Maryjane Hurter, MD  cholecalciferol (VITAMIN D3) 25 MCG (1000 UNIT) tablet Take  2,000 Units by mouth daily.    [provider]  dextromethorphan-guaiFENesin (MUCINEX DM) 30-600 MG 12hr tablet Take 1 tablet by mouth 2 (two) times daily. 06/21/21   Wieters, Hallie C, PA-C  loratadine-pseudoephedrine (CLARITIN-D 12-HOUR) 5-120 MG tablet Take 1 tablet by mouth as needed for allergies.    [provider]  phentermine 15 MG capsule Take 15 mg by mouth daily. 07/08/21   [provider]  Prenatal Vit-Fe Fumarate-FA (MULTIVITAMIN-PRENATAL) 27-0.8 MG TABS tablet Take 1 tablet by mouth daily at 12 noon.    [provider]  triamcinolone ointment (KENALOG) 0.5 % Apply 1 application topically 2 (two) times daily. Patient taking differently: Apply 1 application topically 2 (two) times daily as needed. 10/03/20   Hall-Potvin, [place], PA-C      Allergies    Patient has no known allergies.    Review of Systems   Review of Systems  Constitutional: Negative.   HENT: Negative.    Respiratory: Negative.    Cardiovascular: Negative.   Gastrointestinal:  Positive for abdominal pain and diarrhea. Negative for abdominal distention, anal bleeding, blood in stool, constipation, nausea, rectal pain and vomiting.  Genitourinary: Negative.   Musculoskeletal: Negative.   Skin: Negative.   Neurological: Negative.   All other systems reviewed and are negative.  Physical Exam Updated Vital Signs BP 116/83    Pulse 77    Temp 98.3 F (36.8 C) (Oral)    Resp 15  LMP 02/18/2022 (Within Days)    SpO2 100%  Physical Exam Vitals and nursing note reviewed.  Constitutional:      General: She is not in acute distress.    Appearance: She is well-developed. She is not ill-appearing or toxic-appearing.  HENT:     Head: Normocephalic and atraumatic.  Eyes:     Pupils: Pupils are equal, round, and reactive to light.  Cardiovascular:     Rate and Rhythm: Normal rate.     Heart sounds: Normal heart sounds.  Pulmonary:     Effort: Pulmonary effort is normal. No  respiratory distress.     Breath sounds: Normal breath sounds.     Comments: Clear bilaterally, speaks in full sentences without difficulty Abdominal:     General: Bowel sounds are normal. There is no distension.     Palpations: Abdomen is soft.     Tenderness: There is generalized abdominal tenderness. There is no right CVA tenderness, left CVA tenderness, guarding or rebound.     Hernia: No hernia is present.     Comments: Generalized tenderness, worse to right mid abdomen.  Negative Murphy sign  Musculoskeletal:        General: Normal range of motion.     Cervical back: Normal range of motion.  Skin:    General: Skin is warm and dry.  Neurological:     General: No focal deficit present.     Mental Status: She is alert.  Psychiatric:        Mood and Affect: Mood normal.    ED Results / Procedures / Treatments   Labs (all labs ordered are listed, but only abnormal results are displayed) Labs Reviewed  CBC WITH DIFFERENTIAL/PLATELET - Abnormal; Notable for the following components:      Result Value   Hemoglobin 15.3 (*)    All other components within normal limits  COMPREHENSIVE METABOLIC PANEL - Abnormal; Notable for the following components:   Total Protein 8.5 (*)    All other components within normal limits  URINALYSIS, ROUTINE W REFLEX MICROSCOPIC - Abnormal; Notable for the following components:   Color, Urine STRAW (*)    Hgb urine dipstick SMALL (*)    Bacteria, UA MANY (*)    All other components within normal limits  LIPASE, BLOOD  I-STAT BETA HCG BLOOD, ED ([facility], [facility], [facility] ONLY)    EKG None  Radiology CT Abdomen Pelvis W Contrast  Result Date: 03/04/2022 CLINICAL DATA:  Abdominal pain and nausea. EXAM: CT ABDOMEN AND PELVIS WITH CONTRAST TECHNIQUE: Multidetector CT imaging of the abdomen and pelvis was performed using the standard protocol following bolus administration of intravenous contrast. RADIATION DOSE REDUCTION: This exam was performed according to the  departmental dose-optimization program which includes automated exposure control, adjustment of the mA and/or kV according to patient size and/or use of iterative reconstruction technique. CONTRAST:  158mL OMNIPAQUE IOHEXOL 300 MG/ML  SOLN COMPARISON:  September 11, 2021 FINDINGS: Lower chest: No acute abnormality. Hepatobiliary: A stable 3.5 cm x 2.2 cm x 2.1 cm well-defined, lobulated area of low attenuation is seen within the posterior aspect of the right lobe liver. No gallstones, gallbladder wall thickening, or biliary dilatation. Pancreas: Unremarkable. No pancreatic ductal dilatation or surrounding inflammatory changes. Spleen: Normal in size without focal abnormality. Adrenals/Urinary Tract: Adrenal glands are unremarkable. Kidneys are normal, without renal calculi, focal lesion, or hydronephrosis. Bladder is unremarkable. Stomach/Bowel: Stomach is within normal limits. Appendix appears normal (best seen on coronal reformatted images 51 through 73). No  evidence of bowel dilatation. The cecum and proximal ascending colon are markedly thickened and inflamed. Vascular/Lymphatic: No significant vascular findings are present. Numerous, proximally 9 mm mesenteric lymph nodes are seen along the medial aspect of the mid and lower right abdomen. Reproductive: Uterus and bilateral adnexa are unremarkable. Other: No abdominal wall hernia or abnormality. No abdominopelvic ascites. Musculoskeletal: No acute or significant osseous findings. IMPRESSION: 1. Marked severity colitis involving the cecum and proximal ascending colon. 2. Hepatic steatosis. 3. 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 Electronically Signed   By: Virgina Norfolk M.D.   On: 03/04/2022 20:49    Procedures Procedures    Medications Ordered in ED Medications  cefTRIAXone (ROCEPHIN) 2 g in sodium chloride 0.9 % 100 mL IVPB (0 g Intravenous Stopped 03/04/22 2201)    And  metroNIDAZOLE (FLAGYL) IVPB 500 mg (500 mg  Intravenous New Bag/Given 03/04/22 2113)  iohexol (OMNIPAQUE) 300 MG/ML solution 100 mL (100 mLs Intravenous Contrast Given 03/04/22 2030)  sodium chloride 0.9 % bolus 1,000 mL (0 mLs Intravenous Stopped 03/04/22 2201)    ED Course/ Medical Decision Making/ A&P    21 year old here for evaluation of changes in bowel movements and abdominal pain which began on Sunday.  Multiple episodes of diarrhea without melena or blood per rectum.  No recent antibiotics or travel.  Subsequently took 3 doses of Imodium and becoming constipated and developing right-sided abdominal pain.  She feels bloated.  Pain located right mid abdomen however negative Murphy sign.  She denies any pelvic pain, vaginal discharge or any concerns for any STDs.  She does not anything for pain at this time.  Labs and imaging personally viewed and interpreted: CBC without leukocytosis CMP no significant abnormality Lipase 27 Preg neg UA with many bacteria CT AP with marked colitis, no recent antibiotics to suggest C. difficile colitis.  Patient reassessed.  Discussed results with patient, mother in room.  Will treat with antibiotics. Preference to FU outpatient.  She has previously followed up with [facility] for abdominal pain previously.  States she did have colonoscopy few years ago which did not show any significant findings.  Denies any prior blood per rectum, history of Crohn's or colitis.  Certainly on differential given her age.  In meantime will treat symptomatically, have her follow-up with gastroenterology, return for new or worsening symptoms.  Tolerating p.o. intake, has not needed anything for pain while here in the emergency department.  The patient has been appropriately medically screened and/or stabilized in the ED. I have low suspicion for any other emergent medical condition which would require further screening, evaluation or treatment in the ED or require inpatient management.  Patient is hemodynamically stable and  in no acute distress.  Patient able to ambulate in department prior to ED.  Evaluation does not show acute pathology that would require ongoing or additional emergent interventions while in the emergency department or further inpatient treatment.  I have discussed the diagnosis with the patient and answered all questions.  Pain is been managed while in the emergency department and patient has no further complaints prior to discharge.  Patient is comfortable with plan discussed in room and is stable for discharge at this time.  I have discussed strict return precautions for returning to the emergency department.  Patient was encouraged to follow-up with PCP/specialist refer to at discharge.  Medical Decision Making Amount and/or Complexity of Data Reviewed Labs: ordered. Radiology: ordered.  Risk Prescription drug management.          Final Clinical Impression(s) / ED Diagnoses Final diagnoses:  Colitis    Rx / DC Orders ED Discharge Orders          Ordered    ciprofloxacin (CIPRO) 500 MG tablet  Every 12 hours        03/04/22 2152    metroNIDAZOLE (FLAGYL) 500 MG tablet  2 times daily        03/04/22 2152    ondansetron (ZOFRAN-ODT) 4 MG disintegrating tablet  Every 8 hours PRN        03/04/22 2152    dicyclomine (BENTYL) 20 MG tablet  2 times daily        03/04/22 2152              Gotti Alwin A, PA-C 03/04/22 2213    Daleen Bo, MD 03/04/22 2311

## 2022-03-04 NOTE — ED Triage Notes (Addendum)
Diarrhea since Monday. Yesterday took 3 imodiums, has had severe abdominal pain and nausea since. Had a small BM earlier today, but states that it was "red and pasty". Reports that she had some abnormality with her liver on a previous scan. Worst pain is RUQ. LMP 2 weeks ago.  ?

## 2022-03-08 ENCOUNTER — Telehealth: Payer: Self-pay | Admitting: Gastroenterology

## 2022-03-08 NOTE — Telephone Encounter (Signed)
Patient father called states patient is getting short of breath when standing. Seeking further advise. ?

## 2022-03-08 NOTE — Telephone Encounter (Signed)
Spoke with the father. The patient is complaining of feeling full and bloated. She feels "tight" in her abdomen. Taking Flagyl and Cipro that was prescribed in the ER for diarrhea and abdominal pain. She is not having diarrhea now. No nausea or vomiting. Encourage fluids. Bland diet. Appointment 03/10/22 for persistent abdominal pain. ?

## 2022-03-10 ENCOUNTER — Ambulatory Visit: Payer: Medicaid Other | Admitting: Gastroenterology

## 2022-03-10 ENCOUNTER — Encounter: Payer: Self-pay | Admitting: Gastroenterology

## 2022-03-10 VITALS — BP 90/60 | HR 95 | Ht 61.0 in | Wt 148.0 lb

## 2022-03-10 DIAGNOSIS — A09 Infectious gastroenteritis and colitis, unspecified: Secondary | ICD-10-CM | POA: Insufficient documentation

## 2022-03-10 NOTE — Progress Notes (Signed)
? ? ? ?03/10/2022 ?Janet Rojas ?[id number] ?04-30-2001 ? ? ?HISTORY OF PRESENT ILLNESS: This is a 21 year old female who is a patient Dr. Woodward Ku.  She is here today for follow-up from a recent ED visit last week.  She reports sudden onset of diarrhea, several times for which then she took Imodium.  Then developed abdominal pain/cramping.  Went to the emergency department where her labs looked good. ? ?CT scan of the abdomen and pelvis with contrast on 03/04/2022: ? ?IMPRESSION: ?1. Marked severity colitis involving the cecum and proximal ?ascending colon. ?2. Hepatic steatosis. ?3. 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 ? ? ?She was given prescriptions for Cipro and Flagyl.  No stool studies were performed.  1 to 2 days after her ER visit she was feeling much better.  At this point she has no abdominal pain.  Is having normal bowel movements and actually had to take some MiraLAX.  Pretty much eating what she wants. ? ?Colonoscopy 10/2020 showed only non-bleeding internal hemorrhoids. ? ? ?Past Medical History:  ?Diagnosis Date  ? Allergy   ? Anemia   ? IDA currently well controlled  ? Medical history non-contributory   ? PONV (postoperative nausea and vomiting)   ? sick after wisdom teeth  ? ?Past Surgical History:  ?Procedure Laterality Date  ? TONSILLECTOMY AND ADENOIDECTOMY Bilateral 06/17/2020  ? Procedure: TONSILLECTOMY AND ADENOIDECTOMY;  Surgeon: Leta Baptist, MD;  Location: [facility];  Service: ENT;  Laterality: Bilateral;  ? [place] EXTRACTION  2019  ? ? reports that she has never smoked. She has never used smokeless tobacco. She reports that she does not drink alcohol and does not use drugs. ?family history includes Breast cancer in her maternal aunt; Diabetes in her maternal grandfather. ?No Known Allergies ? ?  ?Outpatient Encounter Medications as of 03/10/2022  ?Medication Sig  ? albuterol (VENTOLIN HFA) 108 (90 Base) MCG/ACT inhaler Inhale 2 puffs  into the lungs every 6 (six) hours as needed for wheezing or shortness of breath.  ? cholecalciferol (VITAMIN D3) 25 MCG (1000 UNIT) tablet Take 2,000 Units by mouth daily.  ? loratadine-pseudoephedrine (CLARITIN-D 12-HOUR) 5-120 MG tablet Take 1 tablet by mouth as needed for allergies.  ? metroNIDAZOLE (FLAGYL) 500 MG tablet Take 1 tablet (500 mg total) by mouth 2 (two) times daily.  ? ondansetron (ZOFRAN-ODT) 4 MG disintegrating tablet Take 1 tablet (4 mg total) by mouth every 8 (eight) hours as needed for nausea or vomiting.  ? Prenatal Vit-Fe Fumarate-FA (MULTIVITAMIN-PRENATAL) 27-0.8 MG TABS tablet Take 1 tablet by mouth daily at 12 noon.  ? triamcinolone ointment (KENALOG) 0.5 % Apply 1 application topically 2 (two) times daily. (Patient taking differently: Apply 1 application. topically 2 (two) times daily as needed.)  ? [DISCONTINUED] ciprofloxacin (CIPRO) 500 MG tablet Take 1 tablet (500 mg total) by mouth every 12 (twelve) hours.  ? [DISCONTINUED] dextromethorphan-guaiFENesin (MUCINEX DM) 30-600 MG 12hr tablet Take 1 tablet by mouth 2 (two) times daily.  ? [DISCONTINUED] dicyclomine (BENTYL) 20 MG tablet Take 1 tablet (20 mg total) by mouth 2 (two) times daily.  ? [DISCONTINUED] phentermine 15 MG capsule Take 15 mg by mouth daily.  ? ?No facility-administered encounter medications on file as of 03/10/2022.  ? ? ? ?REVIEW OF SYSTEMS  : All other systems reviewed and negative except where noted in the History of Present Illness. ? ? ?PHYSICAL EXAM: ?BP 90/60   Pulse 95   Ht 5\' 1"  (1.549 m)  Wt 148 lb (67.1 kg)   LMP 02/18/2022 (Within Days)   BMI 27.96 kg/m?  ?General: Well developed white female in no acute distress ?Head: Normocephalic and atraumatic ?Eyes:  Sclerae anicteric, conjunctiva pink. ?Ears: Normal auditory acuity ?Lungs: Clear throughout to auscultation; no W/R/R. ?Heart: Regular rate and rhythm; no M/R/G. ?Abdomen: Soft, non-distended.  BS present.  Non-tender. ?Musculoskeletal:  Symmetrical with no gross deformities  ?Skin: No lesions on visible extremities ?Extremities: No edema  ?Neurological: Alert oriented x 4, grossly non-focal ?Psychological:  Alert and cooperative. Normal mood and affect ? ?ASSESSMENT AND PLAN: ?*21 year old female here for follow-up from the ED for findings of right-sided colitis on CT scan.  Symptoms were sudden in onset starting with diarrhea and then abdominal cramping.  Labs looked okay.  Stool studies not performed.  She improved quickly within a couple of days after starting Cipro and Flagyl.  She has about completed those antibiotics at this point and is feeling much better, close to baseline.  Suspect that this is likely an acute infectious colitis of some sort.  Very low suspicion for IBD with abrupt onset, quick improvement/resolution.  She just had a colonoscopy 16 months ago that was unremarkable.  Follow-up here as needed. ? ?CC:  [facility]* ? ?  ?

## 2022-03-10 NOTE — Patient Instructions (Signed)
If you are age 22 or older, your body mass index should be between 23-30. Your Body mass index is 27.96 kg/m?Marland Kitchen If this is out of the aforementioned range listed, please consider follow up with your Primary Care Provider. ? ?If you are age 48 or younger, your body mass index should be between 19-25. Your Body mass index is 27.96 kg/m?Marland Kitchen If this is out of the aformentioned range listed, please consider follow up with your Primary Care Provider.  ? ?Follow up as needed. ? ?________________________________________________________ ? ?The [facility] providers would like to encourage you to use [facility] to communicate with providers for non-urgent requests or questions.  Due to long hold times on the telephone, sending your provider a message by [facility] may be a faster and more efficient way to get a response.  Please allow 48 business hours for a response.  Please remember that this is for non-urgent requests.  ?_______________________________________________________ ? ?It was a pleasure to see you today! ? ?Thank you for trusting me with your gastrointestinal care!   ? ?Alonza Bogus, PA-C ? ?

## 2022-03-16 ENCOUNTER — Ambulatory Visit: Payer: Medicaid Other | Admitting: Gastroenterology

## 2022-03-23 NOTE — Progress Notes (Signed)
Reviewed and agree with documentation and assessment and plan. K. Veena Allure Greaser , MD   

## 2022-03-24 ENCOUNTER — Ambulatory Visit: Payer: Medicaid Other | Admitting: Gastroenterology

## 2022-11-26 ENCOUNTER — Emergency Department (HOSPITAL_BASED_OUTPATIENT_CLINIC_OR_DEPARTMENT_OTHER)
Admission: EM | Admit: 2022-11-26 | Discharge: 2022-11-27 | Disposition: A | Discharge status: Home / Self Care | Payer: Medicaid Other | Attending: Emergency Medicine | Admitting: Emergency Medicine

## 2022-11-26 ENCOUNTER — Other Ambulatory Visit: Payer: Self-pay

## 2022-11-26 ENCOUNTER — Emergency Department (HOSPITAL_BASED_OUTPATIENT_CLINIC_OR_DEPARTMENT_OTHER): Payer: Medicaid Other

## 2022-11-26 DIAGNOSIS — R197 Diarrhea, unspecified: Secondary | ICD-10-CM | POA: Insufficient documentation

## 2022-11-26 DIAGNOSIS — R1084 Generalized abdominal pain: Secondary | ICD-10-CM | POA: Diagnosis not present

## 2022-11-26 LAB — COMPREHENSIVE METABOLIC PANEL
ALT: 16 U/L (ref 0–44)
AST: 17 U/L (ref 15–41)
Albumin: 4.7 g/dL (ref 3.5–5.0)
Alkaline Phosphatase: 64 U/L (ref 38–126)
Anion gap: 8 (ref 5–15)
BUN: 11 mg/dL (ref 6–20)
CO2: 28 mmol/L (ref 22–32)
Calcium: 9.5 mg/dL (ref 8.9–10.3)
Chloride: 104 mmol/L (ref 98–111)
Creatinine, Ser: 0.83 mg/dL (ref 0.44–1.00)
GFR, Estimated: >60 mL/min (ref >60)
Glucose, Bld: 99 mg/dL (ref 70–99)
Potassium: 3.8 mmol/L (ref 3.5–5.1)
Sodium: 140 mmol/L (ref 135–145)
Total Bilirubin: 0.3 mg/dL (ref 0.3–1.2)
Total Protein: 7.3 g/dL (ref 6.5–8.1)

## 2022-11-26 LAB — CBC
HCT: 42.6 % (ref 36.0–46.0)
Hemoglobin: 14.4 g/dL (ref 12.0–15.0)
MCH: 30.2 pg (ref 26.0–34.0)
MCHC: 33.8 g/dL (ref 30.0–36.0)
MCV: 89.3 fL (ref 80.0–100.0)
Platelets: 238 10*3/uL (ref 150–400)
RBC: 4.77 MIL/uL (ref 3.87–5.11)
RDW: 13 % (ref 11.5–15.5)
WBC: 7.3 10*3/uL (ref 4.0–10.5)
nRBC: 0 % (ref 0.0–0.2)

## 2022-11-26 LAB — URINALYSIS, ROUTINE W REFLEX MICROSCOPIC
Bilirubin Urine: NEGATIVE
Glucose, UA: NEGATIVE mg/dL
Ketones, ur: NEGATIVE mg/dL
Leukocytes,Ua: NEGATIVE
Nitrite: NEGATIVE
Protein, ur: NEGATIVE mg/dL
Specific Gravity, Urine: 1.014 (ref 1.005–1.030)
pH: 6 (ref 5.0–8.0)

## 2022-11-26 LAB — LIPASE, BLOOD: Lipase: 17 U/L (ref 11–51)

## 2022-11-26 LAB — PREGNANCY, URINE: Preg Test, Ur: NEGATIVE

## 2022-11-26 MED ORDER — IOHEXOL 300 MG/ML  SOLN
100.0000 mL | Freq: Once | INTRAMUSCULAR | Status: AC | PRN
  Administered 2022-11-26: 80 mL via INTRAVENOUS

## 2022-11-26 MED ORDER — ONDANSETRON HCL 4 MG/2ML IJ SOLN
4.0000 mg | Freq: Once | INTRAMUSCULAR | Status: AC
  Administered 2022-11-26: 4 mg via INTRAVENOUS
  Filled 2022-11-26: qty 2

## 2022-11-26 NOTE — ED Triage Notes (Signed)
Lower Abd cramping x 3 days. For first 2 day After each cramp diarrhea would follow. Today unable to go to bathroom but cramps hurt worse. Hx of colitis and states this feel the same. Denies fever or vomitig.

## 2022-11-27 MED ORDER — DICYCLOMINE HCL 10 MG/ML IM SOLN
20.0000 mg | Freq: Once | INTRAMUSCULAR | Status: AC
  Administered 2022-11-27: 20 mg via INTRAMUSCULAR
  Filled 2022-11-27: qty 2

## 2022-11-27 MED ORDER — DICYCLOMINE HCL 20 MG PO TABS: 20.0000 mg | ORAL_TABLET | Freq: Two times a day (BID) | ORAL | 0 refills | Status: DC

## 2022-11-27 NOTE — ED Provider Notes (Signed)
[facility] EMERGENCY DEPT Provider Note   CSN: [id number] Arrival date & time: 11/26/22  1840     History  Chief Complaint  Patient presents with   Abdominal Pain   Diarrhea    Janet Rojas is a 21 y.o. female.  The history is provided by the patient.  Abdominal Pain Pain location:  Generalized Pain quality: cramping   Pain radiates to:  Does not radiate Pain severity:  Moderate Onset quality:  Gradual Duration:  3 days Timing:  Constant Progression:  Unchanged Chronicity:  Recurrent Context: not laxative use   Relieved by:  Nothing Worsened by:  Nothing Ineffective treatments:  None tried Associated symptoms: diarrhea   Associated symptoms: no fever   Diarrhea Quality:  Watery Severity:  Moderate Onset quality:  Gradual Duration:  3 days Timing:  Intermittent Progression:  Unchanged Relieved by:  Nothing Worsened by:  Nothing Ineffective treatments:  None tried Associated symptoms: abdominal pain   Associated symptoms: no fever   Risk factors: no recent antibiotic use        Home Medications Prior to Admission medications   Medication Sig Start Date End Date Taking? Authorizing Provider  albuterol (VENTOLIN HFA) 108 (90 Base) MCG/ACT inhaler Inhale 2 puffs into the lungs every 6 (six) hours as needed for wheezing or shortness of breath. 07/30/21   Omar Person, MD  cholecalciferol (VITAMIN D3) 25 MCG (1000 UNIT) tablet Take 2,000 Units by mouth daily.    [provider]  loratadine-pseudoephedrine (CLARITIN-D 12-HOUR) 5-120 MG tablet Take 1 tablet by mouth as needed for allergies.    [provider]  metroNIDAZOLE (FLAGYL) 500 MG tablet Take 1 tablet (500 mg total) by mouth 2 (two) times daily. 03/04/22   Henderly, Britni A, PA-C  ondansetron (ZOFRAN-ODT) 4 MG disintegrating tablet Take 1 tablet (4 mg total) by mouth every 8 (eight) hours as needed for nausea or vomiting. 03/04/22   Henderly, Britni A, PA-C  Prenatal  Vit-Fe Fumarate-FA (MULTIVITAMIN-PRENATAL) 27-0.8 MG TABS tablet Take 1 tablet by mouth daily at 12 noon.    [provider]  triamcinolone ointment (KENALOG) 0.5 % Apply 1 application topically 2 (two) times daily. Patient taking differently: Apply 1 application. topically 2 (two) times daily as needed. 10/03/20   Hall-Potvin, [place], PA-C      Allergies    Patient has no known allergies.    Review of Systems   Review of Systems  Constitutional:  Negative for fever.  HENT:  Negative for facial swelling.   Eyes:  Negative for redness.  Gastrointestinal:  Positive for abdominal pain and diarrhea.  All other systems reviewed and are negative.   Physical Exam Updated Vital Signs BP 99/61   Pulse 64   Temp 98.5 F (36.9 C) (Oral)   Resp 17   Ht 5\' 1"  (1.549 m)   Wt 67.1 kg   LMP 11/19/2022 (Approximate)   SpO2 100%   BMI 27.96 kg/m  Physical Exam Vitals and nursing note reviewed.  Constitutional:      General: She is not in acute distress.    Appearance: Normal appearance. She is well-developed.  HENT:     Head: Normocephalic and atraumatic.     Nose: Nose normal.  Eyes:     Pupils: Pupils are equal, round, and reactive to light.  Cardiovascular:     Rate and Rhythm: Normal rate and regular rhythm.     Pulses: Normal pulses.     Heart sounds: Normal heart sounds.  Pulmonary:     Effort: Pulmonary effort is normal. No respiratory distress.     Breath sounds: Normal breath sounds.  Abdominal:     General: Bowel sounds are normal. There is no distension.     Palpations: Abdomen is soft.     Tenderness: There is no abdominal tenderness. There is no guarding or rebound.  Genitourinary:    Vagina: No vaginal discharge.  Musculoskeletal:        General: Normal range of motion.     Cervical back: Neck supple.  Skin:    General: Skin is dry.     Capillary Refill: Capillary refill takes less than 2 seconds.     Findings: No erythema or rash.  Neurological:      General: No focal deficit present.     Mental Status: She is alert.     Deep Tendon Reflexes: Reflexes normal.  Psychiatric:        Mood and Affect: Mood normal.     ED Results / Procedures / Treatments   Labs (all labs ordered are listed, but only abnormal results are displayed) Results for orders placed or performed during the hospital encounter of 11/26/22  Lipase, blood  Result Value Ref Range   Lipase 17 11 - 51 U/L  Comprehensive metabolic panel  Result Value Ref Range   Sodium 140 135 - 145 mmol/L   Potassium 3.8 3.5 - 5.1 mmol/L   Chloride 104 98 - 111 mmol/L   CO2 28 22 - 32 mmol/L   Glucose, Bld 99 70 - 99 mg/dL   BUN 11 6 - 20 mg/dL   Creatinine, Ser 7.820.83 0.44 - 1.00 mg/dL   Calcium 9.5 8.9 - 95.610.3 mg/dL   Total Protein 7.3 6.5 - 8.1 g/dL   Albumin 4.7 3.5 - 5.0 g/dL   AST 17 15 - 41 U/L   ALT 16 0 - 44 U/L   Alkaline Phosphatase 64 38 - 126 U/L   Total Bilirubin 0.3 0.3 - 1.2 mg/dL   GFR, Estimated >21>60 >30>60 mL/min   Anion gap 8 5 - 15  CBC  Result Value Ref Range   WBC 7.3 4.0 - 10.5 K/uL   RBC 4.77 3.87 - 5.11 MIL/uL   Hemoglobin 14.4 12.0 - 15.0 g/dL   HCT 86.542.6 78.436.0 - 69.646.0 %   MCV 89.3 80.0 - 100.0 fL   MCH 30.2 26.0 - 34.0 pg   MCHC 33.8 30.0 - 36.0 g/dL   RDW 29.513.0 28.411.5 - 13.215.5 %   Platelets 238 150 - 400 K/uL   nRBC 0.0 0.0 - 0.2 %  Urinalysis, Routine w reflex microscopic Urine, Clean Catch  Result Value Ref Range   Color, Urine YELLOW YELLOW   APPearance CLEAR CLEAR   Specific Gravity, Urine 1.014 1.005 - 1.030   pH 6.0 5.0 - 8.0   Glucose, UA NEGATIVE NEGATIVE mg/dL   Hgb urine dipstick SMALL (A) NEGATIVE   Bilirubin Urine NEGATIVE NEGATIVE   Ketones, ur NEGATIVE NEGATIVE mg/dL   Protein, ur NEGATIVE NEGATIVE mg/dL   Nitrite NEGATIVE NEGATIVE   Leukocytes,Ua NEGATIVE NEGATIVE   RBC / HPF 0-5 0 - 5 RBC/hpf   WBC, UA 0-5 0 - 5 WBC/hpf   Bacteria, UA FEW (A) NONE SEEN   Squamous Epithelial / LPF 6-10 0 - 5   Mucus PRESENT   Pregnancy, urine   Result Value Ref Range   Preg Test, Ur NEGATIVE NEGATIVE   CT ABDOMEN PELVIS W CONTRAST  Result  Date: 11/27/2022 CLINICAL DATA:  Lower abdominal pain. EXAM: CT ABDOMEN AND PELVIS WITH CONTRAST TECHNIQUE: Multidetector CT imaging of the abdomen and pelvis was performed using the standard protocol following bolus administration of intravenous contrast. RADIATION DOSE REDUCTION: This exam was performed according to the departmental dose-optimization program which includes automated exposure control, adjustment of the mA and/or kV according to patient size and/or use of iterative reconstruction technique. CONTRAST:  61mL OMNIPAQUE IOHEXOL 300 MG/ML  SOLN COMPARISON:  03/04/2022 FINDINGS: Lower chest: No acute abnormality Hepatobiliary: Stable cyst posteriorly in the right hepatic lobe. Gallbladder unremarkable. No biliary ductal dilatation. Pancreas: No focal abnormality or ductal dilatation. Spleen: No focal abnormality.  Normal size. Adrenals/Urinary Tract: No adrenal abnormality. No focal renal abnormality. No stones or hydronephrosis. Urinary bladder is unremarkable. Stomach/Bowel: Normal appendix. Stomach, large and small bowel grossly unremarkable. Vascular/Lymphatic: No evidence of aneurysm or adenopathy. Reproductive: Uterus and adnexa unremarkable.  No mass. Other: No free fluid or free air. Musculoskeletal: No acute bony abnormality. IMPRESSION: No acute findings in the abdomen or pelvis. Electronically Signed   By: Charlett Nose M.D.   On: 11/27/2022 00:10     Radiology CT ABDOMEN PELVIS W CONTRAST  Result Date: 11/27/2022 CLINICAL DATA:  Lower abdominal pain. EXAM: CT ABDOMEN AND PELVIS WITH CONTRAST TECHNIQUE: Multidetector CT imaging of the abdomen and pelvis was performed using the standard protocol following bolus administration of intravenous contrast. RADIATION DOSE REDUCTION: This exam was performed according to the departmental dose-optimization program which includes automated exposure  control, adjustment of the mA and/or kV according to patient size and/or use of iterative reconstruction technique. CONTRAST:  18mL OMNIPAQUE IOHEXOL 300 MG/ML  SOLN COMPARISON:  03/04/2022 FINDINGS: Lower chest: No acute abnormality Hepatobiliary: Stable cyst posteriorly in the right hepatic lobe. Gallbladder unremarkable. No biliary ductal dilatation. Pancreas: No focal abnormality or ductal dilatation. Spleen: No focal abnormality.  Normal size. Adrenals/Urinary Tract: No adrenal abnormality. No focal renal abnormality. No stones or hydronephrosis. Urinary bladder is unremarkable. Stomach/Bowel: Normal appendix. Stomach, large and small bowel grossly unremarkable. Vascular/Lymphatic: No evidence of aneurysm or adenopathy. Reproductive: Uterus and adnexa unremarkable.  No mass. Other: No free fluid or free air. Musculoskeletal: No acute bony abnormality. IMPRESSION: No acute findings in the abdomen or pelvis. Electronically Signed   By: Charlett Nose M.D.   On: 11/27/2022 00:10    Procedures Procedures    Medications Ordered in ED Medications  ondansetron (ZOFRAN) injection 4 mg (4 mg Intravenous Given 11/26/22 2330)  iohexol (OMNIPAQUE) 300 MG/ML solution 100 mL (80 mLs Intravenous Contrast Given 11/26/22 2345)    ED Course/ Medical Decision Making/ A&P                           Medical Decision Making Patient with diarrhea and cramping for 3 days   Amount and/or Complexity of Data Reviewed External Data Reviewed: notes.    Details: Previous notes reviewed  Labs: ordered.    Details: All labs reviewed: normal urine without UTI, pregnancy is negative.  Lipase is normal 17.  Normal sodium 140 and potassium 3.8, normal creatinine and LFTS. White count is normal 7.3, normal hemoglobin 14.4, normal platelet count  Radiology: ordered and independent interpretation performed.    Details: Normal CT  Risk Prescription drug management. Risk Details: Well appearing taking PO, likely viral symptoms.  Will start bentyl for cramping.  Stable for discharge.  Strict return.  Follow up with your PMD.  Final Clinical Impression(s) / ED Diagnoses Final diagnoses:  Diarrhea, unspecified type   Return for intractable cough, coughing up blood, fevers > 100.4 unrelieved by medication, shortness of breath, intractable vomiting, chest pain, shortness of breath, weakness, numbness, changes in speech, facial asymmetry, abdominal pain, passing out, Inability to tolerate liquids or food, cough, altered mental status or any concerns. No signs of systemic illness or infection. The patient is nontoxic-appearing on exam and vital signs are within normal limits.  I have reviewed the triage vital signs and the nursing notes. Pertinent labs & imaging results that were available during my care of the patient were reviewed by me and considered in my medical decision making (see chart for details). After history, exam, and medical workup I feel the patient has been appropriately medically screened and is safe for discharge home. Pertinent diagnoses were discussed with the patient. Patient was given return precautions.  Rx / DC Orders ED Discharge Orders     None         Maila Dukes, MD 11/27/22 4008

## 2023-07-04 ENCOUNTER — Ambulatory Visit
Admission: EM | Admit: 2023-07-04 | Discharge: 2023-07-04 | Disposition: A | Discharge status: Home / Self Care | Payer: PRIVATE HEALTH INSURANCE | Attending: Internal Medicine | Admitting: Internal Medicine

## 2023-07-04 DIAGNOSIS — L03112 Cellulitis of left axilla: Secondary | ICD-10-CM

## 2023-07-04 DIAGNOSIS — B372 Candidiasis of skin and nail: Secondary | ICD-10-CM

## 2023-07-04 DIAGNOSIS — W5503XA Scratched by cat, initial encounter: Secondary | ICD-10-CM | POA: Diagnosis not present

## 2023-07-04 MED ORDER — FLUCONAZOLE 150 MG PO TABS: 150.0000 mg | ORAL_TABLET | ORAL | 0 refills | Status: DC

## 2023-07-04 MED ORDER — CLOTRIMAZOLE 1 % EX CREA: TOPICAL_CREAM | CUTANEOUS | 0 refills | Status: DC

## 2023-07-04 MED ORDER — SULFAMETHOXAZOLE-TRIMETHOPRIM 800-160 MG PO TABS: 1.0000 | ORAL_TABLET | Freq: Two times a day (BID) | ORAL | 0 refills | Status: AC

## 2023-07-04 NOTE — ED Provider Notes (Signed)
[name]    CSN: [id number] Arrival date & time: 07/04/23  1103      History   Chief Complaint Chief Complaint  Patient presents with   Allergic Reaction   Animal Bite    HPI Janet Rojas is a 22 y.o. female.   Patient presents with 2 different chief complaints today.  She reports that she has an itchy and burning rash present to left armpit that has been present for a few days.  Patient denies any changes to lotions, soaps, detergents, deodorants, etc.  Reports that she applied aloe but it made it worse.  Denies any fever associated with this.  Reports that she noted some purulent drainage over the past few days as well.  Also reporting a cat scratch to her right arm that occurred yesterday.  Reports cat is up-to-date on all vaccines.  Last tetanus vaccine was about a year ago per patient report. Denies any cat bite.    Allergic Reaction Animal Bite   Past Medical History:  Diagnosis Date   Allergy    Anemia    IDA currently well controlled   Medical history non-contributory    PONV (postoperative nausea and vomiting)    sick after wisdom teeth    Patient Active Problem List   Diagnosis Date Noted   Infectious colitis 03/10/2022   Iron deficiency 11/29/2019   Dietary iron deficiency without anemia 10/18/2018   Vitamin D deficiency 10/18/2018   Allergic rhinitis 10/18/2018    Past Surgical History:  Procedure Laterality Date   TONSILLECTOMY AND ADENOIDECTOMY Bilateral 06/17/2020   Procedure: TONSILLECTOMY AND ADENOIDECTOMY;  Surgeon: Newman Pies, MD;  Location: [facility];  Service: ENT;  Laterality: Bilateral;   WISDOM TOOTH EXTRACTION  2019    OB History   No obstetric history on file.      Home Medications    Prior to Admission medications   Medication Sig Start Date End Date Taking? Authorizing Provider  clotrimazole (LOTRIMIN) 1 % cream Apply to affected area 2 times daily 07/04/23  Yes Anchor Dwan, Hopkins E, FNP  fluconazole  (DIFLUCAN) 150 MG tablet Take 1 tablet (150 mg total) by mouth once a week. 07/04/23  Yes Neville Pauls, Rolly Salter E, FNP  sulfamethoxazole-trimethoprim (BACTRIM DS) 800-160 MG tablet Take 1 tablet by mouth 2 (two) times daily for 7 days. 07/04/23 07/11/23 Yes Jullie Arps, Acie Fredrickson, FNP  albuterol (VENTOLIN HFA) 108 (90 Base) MCG/ACT inhaler Inhale 2 puffs into the lungs every 6 (six) hours as needed for wheezing or shortness of breath. 07/30/21   Omar Person, MD    Family History Family History  Problem Relation Age of Onset   Diabetes Maternal Grandfather    Breast cancer Maternal Aunt    Colon cancer Neg Hx    Stomach cancer Neg Hx    Esophageal cancer Neg Hx     Social History Social History   Tobacco Use   Smoking status: Never   Smokeless tobacco: Never  Vaping Use   Vaping Use: Never used  Substance Use Topics   Alcohol use: Never   Drug use: Never     Allergies   Patient has no known allergies.   Review of Systems Review of Systems Per HPI  Physical Exam Triage Vital Signs ED Triage Vitals [07/04/23 1342]  Enc Vitals Group     BP 126/84     Pulse Rate 78     Resp 18     Temp 98.1 F (36.7 C)  Temp Source Oral     SpO2 100 %     Weight      Height      Head Circumference      Peak Flow      Pain Score 0     Pain Loc      Pain Edu?      Excl. in GC?    No data found.  Updated Vital Signs BP 126/84 (BP Location: Left Arm)   Pulse 78   Temp 98.1 F (36.7 C) (Oral)   Resp 18   LMP 06/16/2023   SpO2 100%   Visual Acuity Right Eye Distance:   Left Eye Distance:   Bilateral Distance:    Right Eye Near:   Left Eye Near:    Bilateral Near:     Physical Exam Constitutional:      General: She is not in acute distress.    Appearance: Normal appearance. She is not toxic-appearing or diaphoretic.  HENT:     Head: Normocephalic and atraumatic.  Eyes:     Extraocular Movements: Extraocular movements intact.     Conjunctiva/sclera: Conjunctivae normal.   Pulmonary:     Effort: Pulmonary effort is normal.  Skin:    Comments: Patient has diffuse erythema present throughout left axilla.  There is some purulent drainage/honey crusted lesions to various areas as well.  No obvious abscess or area of induration or fluctuance.  Has approximately 2 inch linear very superficial abrasion present to the right distal forearm due to cat scratch.  There is no bleeding noted.  No purulent drainage or surrounding swelling or erythema.  Neurological:     General: No focal deficit present.     Mental Status: She is alert and oriented to person, place, and time. Mental status is at baseline.  Psychiatric:        Mood and Affect: Mood normal.        Behavior: Behavior normal.        Thought Content: Thought content normal.        Judgment: Judgment normal.      UC Treatments / Results  Labs (all labs ordered are listed, but only abnormal results are displayed) Labs Reviewed - No data to display  EKG   Radiology No results found.  Procedures Procedures (including critical care time)  Medications Ordered in UC Medications - No data to display  Initial Impression / Assessment and Plan / UC Course  I have reviewed the triage vital signs and the nursing notes.  Pertinent labs & imaging results that were available during my care of the patient were reviewed by me and considered in my medical decision making (see chart for details).     1.  Left axillary rash  I am concerned for candidiasis of the skin to the left axilla.  Will treat with Diflucan and clotrimazole.  I am also concerned that it has developed into a bacterial infection so Bactrim was prescribed to help treat this.  Advised to monitor for worsening or persistent symptoms and follow-up if they occur.  2.  Cat scratch  There were no signs of infection to cat scratch.  Patient denies any bite.  Cat is also up-to-date on vaccines.  Advised to monitor for signs of infection and  follow-up if they occur.  Although, Bactrim should help cover for this as well.  Tetanus vaccine is up-to-date per patient report.  Advised strict return precautions.  Patient verbalized understanding and was agreeable with plan.  Final Clinical Impressions(s) / UC Diagnoses   Final diagnoses:  Cat scratch  Candidal skin infection  Cellulitis of axilla, left     Discharge Instructions      I have prescribed you 3 medications.  Diflucan is a pill to take for yeast infection and clotrimazole is a cream that you will apply for yeast infection of the skin.  I have also prescribed an antibiotic that you will take by mouth to treat any bacterial infection of the skin.  Monitor for worsening symptoms and follow-up they occur.  Also recommend that you monitor the cat scratch for any increased redness, swelling, pus and follow-up if this occurs.    ED Prescriptions     Medication Sig Dispense Auth. Provider   sulfamethoxazole-trimethoprim (BACTRIM DS) 800-160 MG tablet Take 1 tablet by mouth 2 (two) times daily for 7 days. 14 tablet [place], [place] E, [place]   fluconazole (DIFLUCAN) 150 MG tablet Take 1 tablet (150 mg total) by mouth once a week. 3 tablet [place], [place] E, [place]   clotrimazole (LOTRIMIN) 1 % cream Apply to affected area 2 times daily 45 g Gustavus Bryant, [place]      PDMP not reviewed this encounter.   Gustavus Bryant, [place] 07/04/23 [phone]

## 2023-07-04 NOTE — ED Triage Notes (Signed)
Pt states has a rash/burn under lt arm after using old spice deodorant 5 days ago. States has used if for 5-6 years with no problem.   Pt states her cat scratched her rt anterior wrist yesterday. Cat is updated on shots. Tetanus is up today.

## 2023-07-04 NOTE — Discharge Instructions (Signed)
I have prescribed you 3 medications.  Diflucan is a pill to take for yeast infection and clotrimazole is a cream that you will apply for yeast infection of the skin.  I have also prescribed an antibiotic that you will take by mouth to treat any bacterial infection of the skin.  Monitor for worsening symptoms and follow-up they occur.  Also recommend that you monitor the cat scratch for any increased redness, swelling, pus and follow-up if this occurs.

## 2024-01-28 IMAGING — CT CT ABD-PELV W/ CM
2 of 4 series · 16 of 46 positions shown, 18 images · IV contrast (agent unspecified)
Comparison: September 11, 2021

CLINICAL DATA: Abdominal pain and nausea.

EXAM:
CT ABDOMEN AND PELVIS WITH CONTRAST
TECHNIQUE: Multidetector CT imaging of the abdomen and pelvis was performed
using the standard protocol following bolus administration of
intravenous contrast.

[Series 2: axial st · axial · 0.72mm/px · z∈[+959,+1359]mm · 13 of 92 slices shown, 15 images]
[im 6/92  soft-tissue]
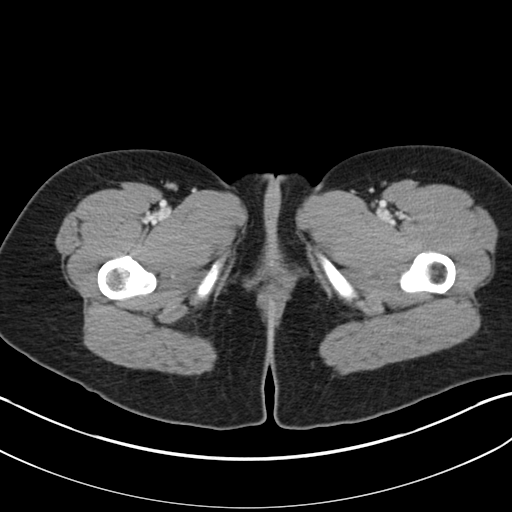
[im 6/92  bone]
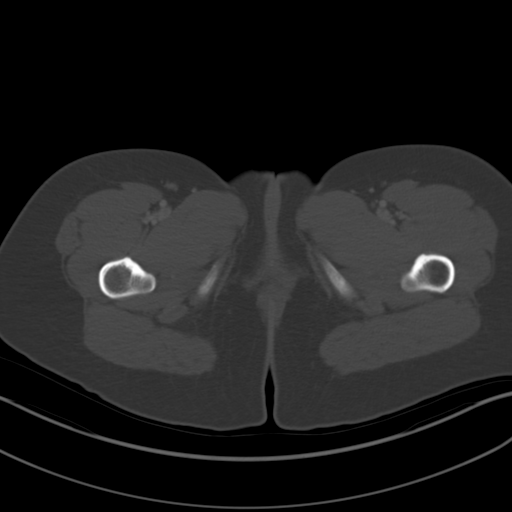
[im 11/92  soft-tissue]
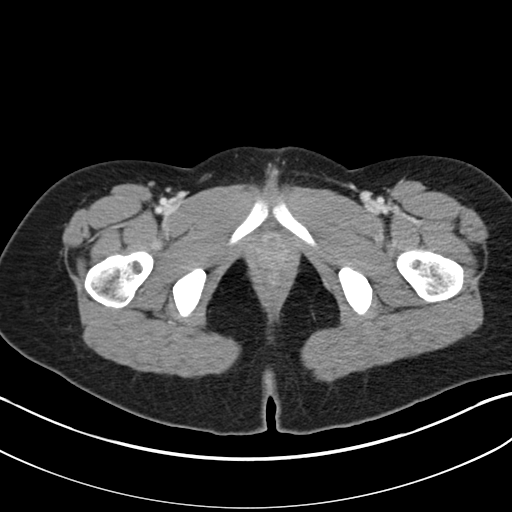
[im 22/92  soft-tissue]
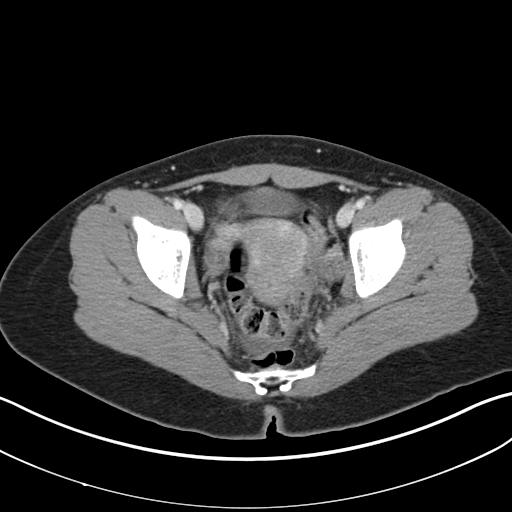
[im 27/92  soft-tissue]
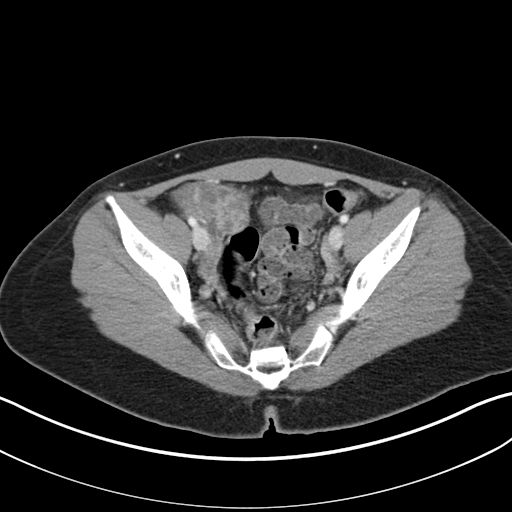
[im 33/92  soft-tissue]
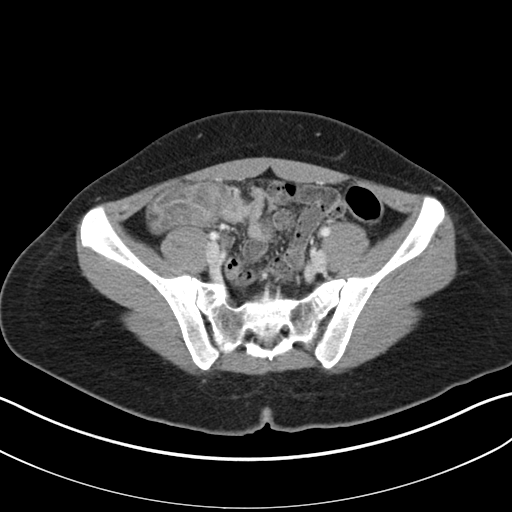
[im 38/92  soft-tissue]
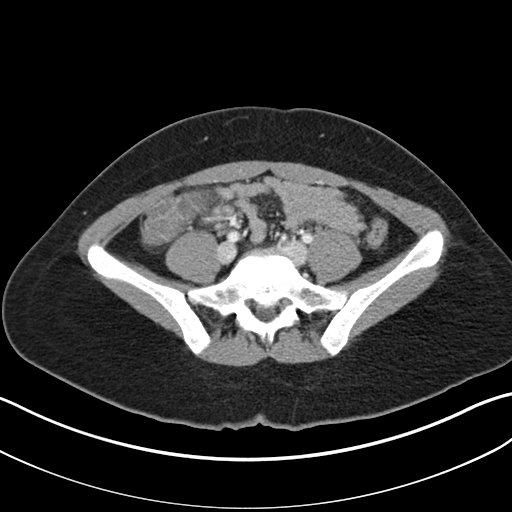
[im 49/92  soft-tissue]
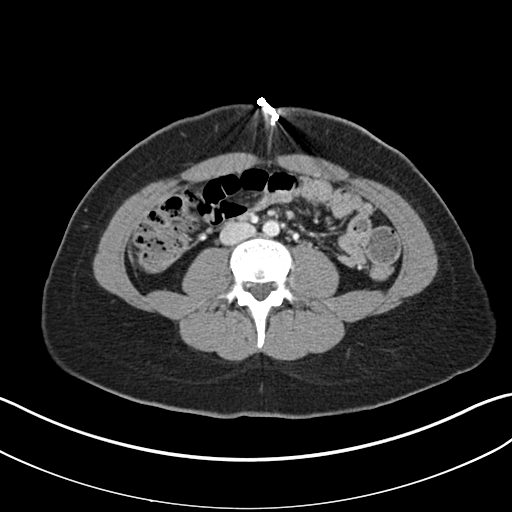
[im 54/92  soft-tissue]
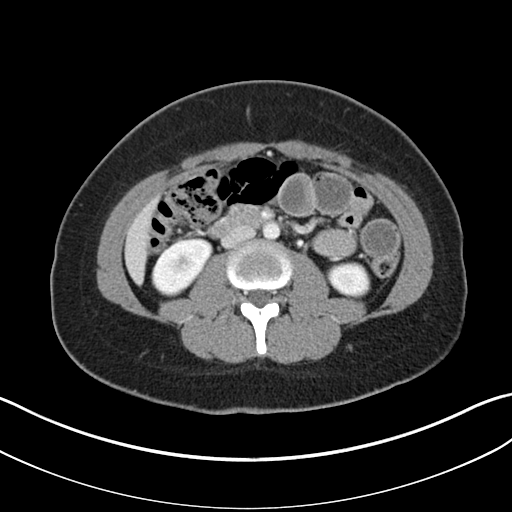
[im 59/92  soft-tissue]
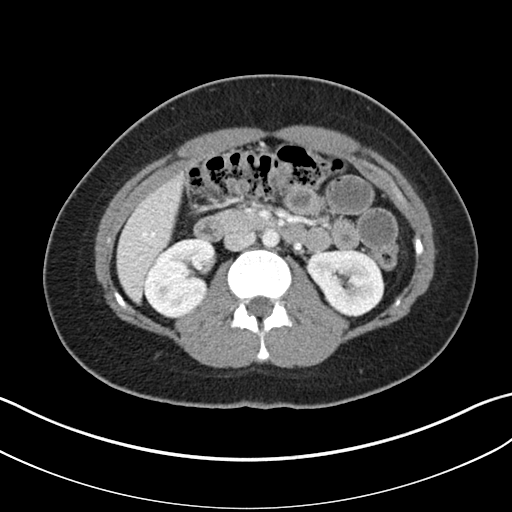
[im 59/92  bone]
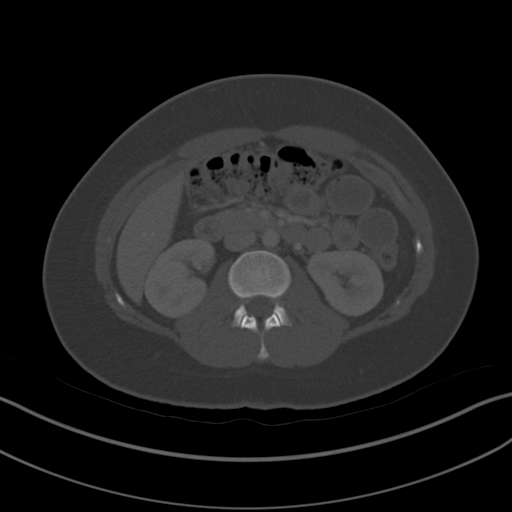
[im 65/92  soft-tissue]
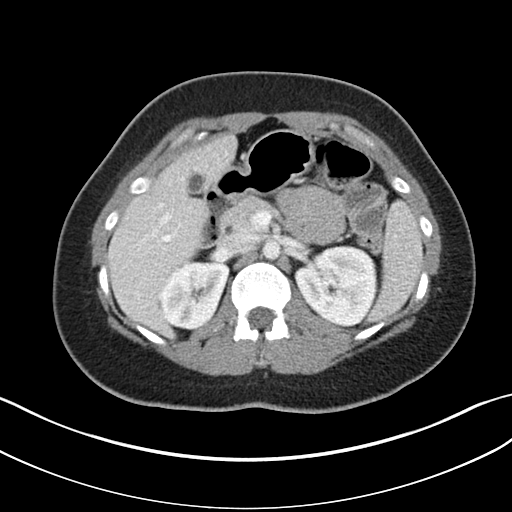
[im 70/92  soft-tissue]
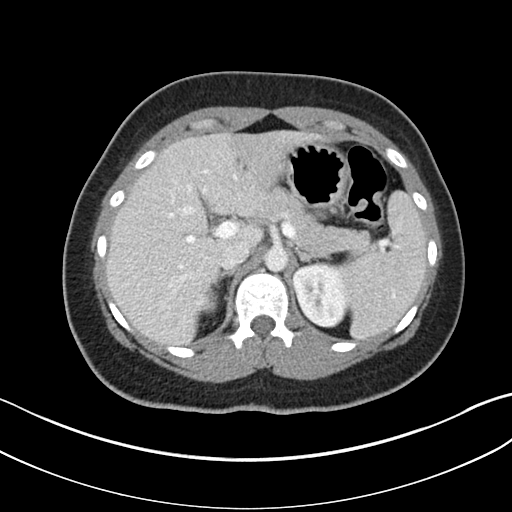
[im 81/92  soft-tissue]
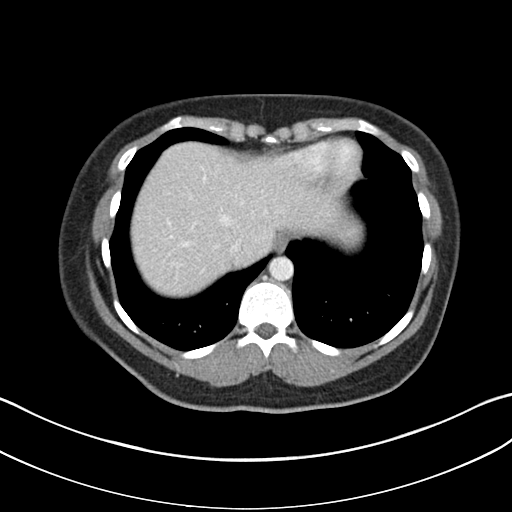
[im 86/92  soft-tissue]
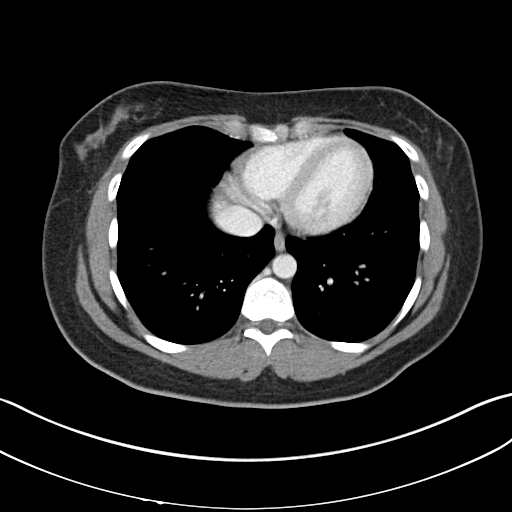

[Series 4: coronal st · coronal · 0.87mm/px · 3 of 118 slices shown]
[im 40/118  soft-tissue]
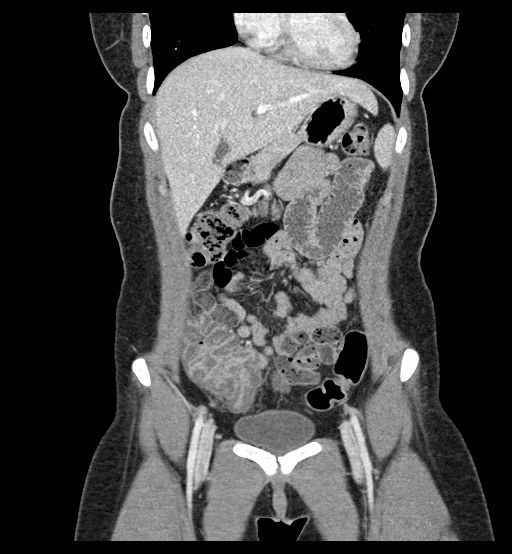
[im 53/118  soft-tissue]
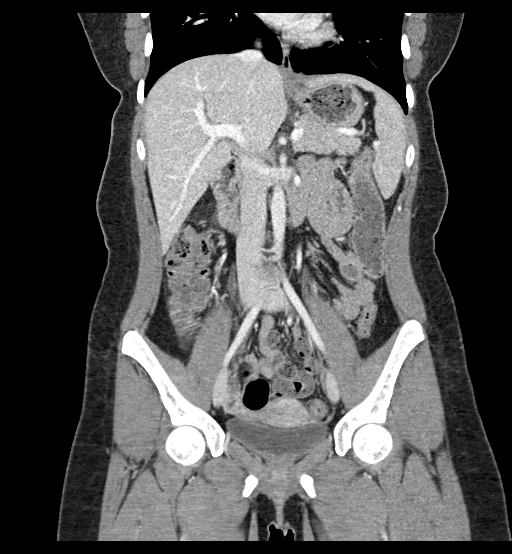
[im 66/118  soft-tissue]
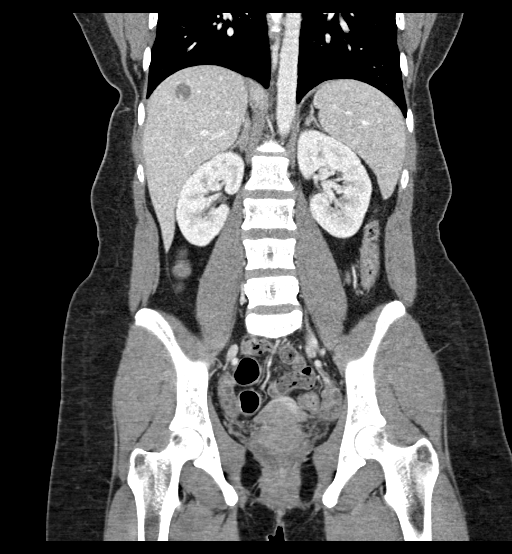

[16 of 46 positions shown; findings below may reference images not displayed]

RADIATION DOSE REDUCTION: This exam was performed according to the
departmental dose-optimization program which includes automated
exposure control, adjustment of the mA and/or kV according to
patient size and/or use of iterative reconstruction technique.

CONTRAST:  100mL OMNIPAQUE IOHEXOL 300 MG/ML  SOLN
FINDINGS: Lower chest: No acute abnormality.

Hepatobiliary: A stable 3.5 cm x 2.2 cm x 2.1 cm well-defined,
lobulated area of low attenuation is seen within the posterior
aspect of the right lobe liver. No gallstones, gallbladder wall
thickening, or biliary dilatation.

Pancreas: Unremarkable. No pancreatic ductal dilatation or
surrounding inflammatory changes.

Spleen: Normal in size without focal abnormality.

Adrenals/Urinary Tract: Adrenal glands are unremarkable. Kidneys are
normal, without renal calculi, focal lesion, or hydronephrosis.
Bladder is unremarkable.

Stomach/Bowel: Stomach is within normal limits. Appendix appears
normal (best seen on coronal reformatted images 51 through 73). No
evidence of bowel dilatation. The cecum and proximal ascending colon
are markedly thickened and inflamed.

Vascular/Lymphatic: No significant vascular findings are present.
Numerous, proximally 9 mm mesenteric lymph nodes are seen along the
medial aspect of the mid and lower right abdomen.

Reproductive: Uterus and bilateral adnexa are unremarkable.

Other: No abdominal wall hernia or abnormality. No abdominopelvic
ascites.

Musculoskeletal: No acute or significant osseous findings.
IMPRESSION: 1. Marked severity colitis involving the cecum and proximal
ascending colon.
2. Hepatic steatosis.
3. Stable liver lesion likely consistent with a benign hepatic
hemangioma.

## 2024-02-20 ENCOUNTER — Other Ambulatory Visit: Payer: Self-pay

## 2024-02-20 ENCOUNTER — Encounter (HOSPITAL_BASED_OUTPATIENT_CLINIC_OR_DEPARTMENT_OTHER): Payer: Self-pay | Admitting: Emergency Medicine

## 2024-02-20 ENCOUNTER — Emergency Department (HOSPITAL_BASED_OUTPATIENT_CLINIC_OR_DEPARTMENT_OTHER): Payer: PRIVATE HEALTH INSURANCE | Admitting: Radiology

## 2024-02-20 DIAGNOSIS — R059 Cough, unspecified: Secondary | ICD-10-CM | POA: Diagnosis present

## 2024-02-20 DIAGNOSIS — J101 Influenza due to other identified influenza virus with other respiratory manifestations: Secondary | ICD-10-CM | POA: Diagnosis not present

## 2024-02-20 LAB — RESP PANEL BY RT-PCR (RSV, FLU A&B, COVID)  RVPGX2
Influenza A by PCR: POSITIVE — AB
Influenza B by PCR: NEGATIVE
Resp Syncytial Virus by PCR: NEGATIVE
SARS Coronavirus 2 by RT PCR: NEGATIVE

## 2024-02-20 LAB — CBC
HCT: 40.7 % (ref 36.0–46.0)
Hemoglobin: 14.3 g/dL (ref 12.0–15.0)
MCH: 30.8 pg (ref 26.0–34.0)
MCHC: 35.1 g/dL (ref 30.0–36.0)
MCV: 87.7 fL (ref 80.0–100.0)
Platelets: 228 10*3/uL (ref 150–400)
RBC: 4.64 MIL/uL (ref 3.87–5.11)
RDW: 12.3 % (ref 11.5–15.5)
WBC: 6.4 10*3/uL (ref 4.0–10.5)
nRBC: 0 % (ref 0.0–0.2)

## 2024-02-20 LAB — BASIC METABOLIC PANEL
Anion gap: 11 (ref 5–15)
BUN: 14 mg/dL (ref 6–20)
CO2: 26 mmol/L (ref 22–32)
Calcium: 9.6 mg/dL (ref 8.9–10.3)
Chloride: 103 mmol/L (ref 98–111)
Creatinine, Ser: 0.81 mg/dL (ref 0.44–1.00)
GFR, Estimated: >60 mL/min (ref >60)
Glucose, Bld: 93 mg/dL (ref 70–99)
Potassium: 3.2 mmol/L — ABNORMAL LOW (ref 3.5–5.1)
Sodium: 140 mmol/L (ref 135–145)

## 2024-02-20 LAB — PREGNANCY, URINE: Preg Test, Ur: NEGATIVE

## 2024-02-20 LAB — TROPONIN I (HIGH SENSITIVITY): Troponin I (High Sensitivity): <2 ng/L (ref <18)

## 2024-02-20 NOTE — ED Triage Notes (Signed)
 Cough, runny nose, chills night sweats. Started this week while traveling in [place]. C/o chest pressure tightness in chest and reports coughing up blood tinged sputum

## 2024-02-21 ENCOUNTER — Emergency Department (HOSPITAL_BASED_OUTPATIENT_CLINIC_OR_DEPARTMENT_OTHER)
Admission: EM | Admit: 2024-02-21 | Discharge: 2024-02-21 | Disposition: A | Discharge status: Home / Self Care | Payer: PRIVATE HEALTH INSURANCE | Attending: Emergency Medicine | Admitting: Emergency Medicine

## 2024-02-21 DIAGNOSIS — J101 Influenza due to other identified influenza virus with other respiratory manifestations: Secondary | ICD-10-CM

## 2024-02-21 MED ORDER — SODIUM CHLORIDE 0.9 % IV BOLUS
1000.0000 mL | Freq: Once | INTRAVENOUS | Status: AC
  Administered 2024-02-21: 1000 mL via INTRAVENOUS

## 2024-02-21 MED ORDER — NAPROXEN 500 MG PO TABS: 500.0000 mg | ORAL_TABLET | Freq: Two times a day (BID) | ORAL | 0 refills | Status: DC

## 2024-02-21 MED ORDER — ONDANSETRON 4 MG PO TBDP: 4.0000 mg | ORAL_TABLET | Freq: Three times a day (TID) | ORAL | 0 refills | Status: DC | PRN

## 2024-02-21 NOTE — ED Notes (Signed)
 RN reviewed discharge instructions with pt. Pt verbalized understanding and had no further questions. VSS upon discharge.

## 2024-02-21 NOTE — ED Provider Notes (Signed)
 [facility] Emergency Department Provider Note MRN:  [id number]  Arrival date & time: 02/21/24     Chief Complaint   Cough and Chest Pain   History of Present Illness   Janet Rojas is a 23 y.o. year-old female with no pertinent past medical history presenting to the ED with chief complaint of cough and chest pain.  Cough, malaise, fatigue, body aches, headaches, nasal congestion, sore throat, nausea.  All for the past week.  Review of Systems  A thorough review of systems was obtained and all systems are negative except as noted in the HPI and PMH.   Patient's Health History    Past Medical History:  Diagnosis Date   Allergy    Anemia    IDA currently well controlled   Medical history non-contributory    PONV (postoperative nausea and vomiting)    sick after wisdom teeth    Past Surgical History:  Procedure Laterality Date   TONSILLECTOMY AND ADENOIDECTOMY Bilateral 06/17/2020   Procedure: TONSILLECTOMY AND ADENOIDECTOMY;  Surgeon: Newman Pies, MD;  Location: [facility];  Service: ENT;  Laterality: Bilateral;   WISDOM TOOTH EXTRACTION  2019    Family History  Problem Relation Age of Onset   Diabetes Maternal Grandfather    Breast cancer Maternal Aunt    Colon cancer Neg Hx    Stomach cancer Neg Hx    Esophageal cancer Neg Hx     Social History   Socioeconomic History   Marital status: Single    Spouse name: Not on file   Number of children: Not on file   Years of education: Not on file   Highest education level: Not on file  Occupational History   Not on file  Tobacco Use   Smoking status: Never   Smokeless tobacco: Never  Vaping Use   Vaping status: Never Used  Substance and Sexual Activity   Alcohol use: Never   Drug use: Never   Sexual activity: Not on file  Other Topics Concern   Not on file  Social History Narrative   Not on file   Social Drivers of Health   Financial Resource Strain: Not on file  Food  Insecurity: Not on file  Transportation Needs: Not on file  Physical Activity: Not on file  Stress: Not on file  Social Connections: Unknown (06/22/2022)   Received from [facility]   Social Network    Social Network: Not on file  Intimate Partner Violence: Unknown (06/22/2022)   Received from [facility], [facility]   HITS    Physically Hurt: Not on file    Insult or Talk Down To: Not on file    Threaten Physical Harm: Not on file    Scream or Curse: Not on file     Physical Exam   Vitals:   02/21/24 0100 02/21/24 0140  BP: 111/77 119/79  Pulse: 71 (!) 54  Resp: 18 18  Temp:    SpO2: 100% 100%    CONSTITUTIONAL: Well-appearing, NAD NEURO/PSYCH:  Alert and oriented x 3, no focal deficits EYES:  eyes equal and reactive ENT/NECK:  no LAD, no JVD CARDIO: Regular rate, well-perfused, normal S1 and S2 PULM:  CTAB no wheezing or rhonchi GI/GU:  non-distended, non-tender MSK/SPINE:  No gross deformities, no edema SKIN:  no rash, atraumatic   *Additional and/or pertinent findings included in MDM below  Diagnostic and Interventional Summary    EKG Interpretation Date/Time:  Monday February 20 2024 20:05:08 EST Ventricular Rate:  77 PR Interval:  130 QRS Duration:  82 QT Interval:  378 QTC Calculation: 427 R Axis:   89  Text Interpretation: Normal sinus rhythm Normal ECG No previous ECGs available Confirmed by Kennis Carina [phone]) on 02/21/2024 1:01:28 AM       Labs Reviewed  RESP PANEL BY RT-PCR (RSV, FLU A&B, COVID)  RVPGX2 - Abnormal; Notable for the following components:      Result Value   Influenza A by PCR POSITIVE (*)    All other components within normal limits  BASIC METABOLIC PANEL - Abnormal; Notable for the following components:   Potassium 3.2 (*)    All other components within normal limits  CBC  PREGNANCY, URINE  TROPONIN I (HIGH SENSITIVITY)    DG Chest 2 View  Final Result      Medications  sodium chloride 0.9 %  bolus 1,000 mL (1,000 mLs Intravenous New Bag/Given 02/21/24 0135)     Procedures  /  Critical Care Procedures  ED Course and Medical Decision Making  Initial Impression and Ddx Patient is well-appearing in no acute distress with reassuring vital signs.  Recent travel to [place].  Symptoms seem to be URI in etiology, likely viral.  Considering influenza.  Soft abdomen, no jaundice, highly doubt hepatitis, doubt typhoid, doubt other disease is endemic to [place].  Also endorsing some chest discomfort, obtaining screening EKG, chest x-ray, troponin to ensure no evidence of endocarditis.  Past medical/surgical history that increases complexity of ED encounter: None  Interpretation of Diagnostics I personally reviewed the EKG and my interpretation is as follows: Sinus rhythm without concerning features  Chest x-ray normal, labs reassuring with no significant blood count or electrolyte disturbance, troponin negative.  Patient Reassessment and Ultimate Disposition/Management     Patient is flu positive confirmed.  She would like some IV fluids prior to discharge.  Patient management required discussion with the following services or consulting groups:  None  Complexity of Problems Addressed Acute illness or injury that poses threat of life of bodily function  Additional Data Reviewed and Analyzed Further history obtained from: Further history from spouse/family member  Additional Factors Impacting ED Encounter Risk Prescriptions  Elmer Sow. Pilar Plate, MD [facility] Emergency Medicine [facility] [email or web address]  Final Clinical Impressions(s) / ED Diagnoses     ICD-10-CM   1. Influenza A  J10.1       ED Discharge Orders          Ordered    ondansetron (ZOFRAN-ODT) 4 MG disintegrating tablet  Every 8 hours PRN        02/21/24 0149    naproxen (NAPROSYN) 500 MG tablet  2 times daily        02/21/24 0149             Discharge Instructions Discussed with  and Provided to Patient:    Discharge Instructions      You were evaluated in the Emergency Department and after careful evaluation, we did not find any emergent condition requiring admission or further testing in the hospital.  Your exam/testing today is overall reassuring.  Your symptoms seem to be due to the flu.  You tested positive here in the emergency department.  Recommend Naprosyn anti-inflammatory prescribed twice daily for discomfort.  Can use the Zofran as needed for nausea.  Plenty of fluids and rest.  Please return to the Emergency Department if you experience any worsening of your condition.   Thank you  for allowing [place] to be a part of your care.      Sabas Sous, MD 02/21/24 [id number]

## 2024-02-21 NOTE — Discharge Instructions (Signed)
 You were evaluated in the Emergency Department and after careful evaluation, we did not find any emergent condition requiring admission or further testing in the hospital.  Your exam/testing today is overall reassuring.  Your symptoms seem to be due to the flu.  You tested positive here in the emergency department.  Recommend Naprosyn anti-inflammatory prescribed twice daily for discomfort.  Can use the Zofran as needed for nausea.  Plenty of fluids and rest.  Please return to the Emergency Department if you experience any worsening of your condition.   Thank you for allowing [place] to be a part of your care.

## 2024-02-21 NOTE — ED Notes (Signed)
 Pt has been in [place], cough, runny nose, chills, night sweats and body aches Pt is Flu+ Pt does report chest pressure/tightness & blood tinged sputum

## 2024-10-30 ENCOUNTER — Inpatient Hospital Stay (HOSPITAL_BASED_OUTPATIENT_CLINIC_OR_DEPARTMENT_OTHER)
Admission: EM | Admit: 2024-10-30 | Discharge: 2024-11-02 | DRG: 690 | Disposition: A | Discharge status: Home / Self Care | Payer: PRIVATE HEALTH INSURANCE | Attending: Internal Medicine | Admitting: Internal Medicine

## 2024-10-30 ENCOUNTER — Emergency Department (HOSPITAL_BASED_OUTPATIENT_CLINIC_OR_DEPARTMENT_OTHER): Payer: PRIVATE HEALTH INSURANCE

## 2024-10-30 ENCOUNTER — Encounter (HOSPITAL_BASED_OUTPATIENT_CLINIC_OR_DEPARTMENT_OTHER): Payer: Self-pay

## 2024-10-30 ENCOUNTER — Other Ambulatory Visit: Payer: Self-pay

## 2024-10-30 DIAGNOSIS — B9620 Unspecified Escherichia coli [E. coli] as the cause of diseases classified elsewhere: Secondary | ICD-10-CM | POA: Diagnosis present

## 2024-10-30 DIAGNOSIS — Z79899 Other long term (current) drug therapy: Secondary | ICD-10-CM

## 2024-10-30 DIAGNOSIS — Z87440 Personal history of urinary (tract) infections: Secondary | ICD-10-CM

## 2024-10-30 DIAGNOSIS — K5900 Constipation, unspecified: Secondary | ICD-10-CM | POA: Diagnosis present

## 2024-10-30 DIAGNOSIS — N10 Acute tubulo-interstitial nephritis: Principal | ICD-10-CM | POA: Diagnosis present

## 2024-10-30 DIAGNOSIS — Z833 Family history of diabetes mellitus: Secondary | ICD-10-CM

## 2024-10-30 DIAGNOSIS — Z882 Allergy status to sulfonamides status: Secondary | ICD-10-CM

## 2024-10-30 DIAGNOSIS — N12 Tubulo-interstitial nephritis, not specified as acute or chronic: Principal | ICD-10-CM

## 2024-10-30 DIAGNOSIS — Z803 Family history of malignant neoplasm of breast: Secondary | ICD-10-CM

## 2024-10-30 DIAGNOSIS — R1012 Left upper quadrant pain: Secondary | ICD-10-CM | POA: Diagnosis not present

## 2024-10-30 DIAGNOSIS — N390 Urinary tract infection, site not specified: Secondary | ICD-10-CM | POA: Insufficient documentation

## 2024-10-30 DIAGNOSIS — O039 Complete or unspecified spontaneous abortion without complication: Secondary | ICD-10-CM | POA: Insufficient documentation

## 2024-10-30 LAB — COMPREHENSIVE METABOLIC PANEL WITH GFR
ALT: 17 U/L (ref 0–44)
AST: 18 U/L (ref 15–41)
Albumin: 4.6 g/dL (ref 3.5–5.0)
Alkaline Phosphatase: 59 U/L (ref 38–126)
Anion gap: 12 (ref 5–15)
BUN: 10 mg/dL (ref 6–20)
CO2: 25 mmol/L (ref 22–32)
Calcium: 9.6 mg/dL (ref 8.9–10.3)
Chloride: 100 mmol/L (ref 98–111)
Creatinine, Ser: 0.7 mg/dL (ref 0.44–1.00)
GFR, Estimated: >60 mL/min (ref >60)
Glucose, Bld: 130 mg/dL — ABNORMAL HIGH (ref 70–99)
Potassium: 3.6 mmol/L (ref 3.5–5.1)
Sodium: 137 mmol/L (ref 135–145)
Total Bilirubin: 0.7 mg/dL (ref 0.0–1.2)
Total Protein: 7.5 g/dL (ref 6.5–8.1)

## 2024-10-30 LAB — URINALYSIS, ROUTINE W REFLEX MICROSCOPIC
Bilirubin Urine: NEGATIVE
Glucose, UA: NEGATIVE mg/dL
Ketones, ur: 15 mg/dL — AB
Nitrite: NEGATIVE
Protein, ur: 100 mg/dL — AB
RBC / HPF: >50 RBC/hpf (ref 0–5)
Specific Gravity, Urine: 1.014 (ref 1.005–1.030)
WBC, UA: >50 WBC/hpf (ref 0–5)
pH: 8 (ref 5.0–8.0)

## 2024-10-30 LAB — CBC
HCT: 38.2 % (ref 36.0–46.0)
Hemoglobin: 13.2 g/dL (ref 12.0–15.0)
MCH: 29.9 pg (ref 26.0–34.0)
MCHC: 34.6 g/dL (ref 30.0–36.0)
MCV: 86.6 fL (ref 80.0–100.0)
Platelets: 218 K/uL (ref 150–400)
RBC: 4.41 MIL/uL (ref 3.87–5.11)
RDW: 12.6 % (ref 11.5–15.5)
WBC: 11.1 K/uL — ABNORMAL HIGH (ref 4.0–10.5)
nRBC: 0 % (ref 0.0–0.2)

## 2024-10-30 LAB — LACTIC ACID, PLASMA: Lactic Acid, Venous: 2.3 mmol/L (ref 0.5–1.9)

## 2024-10-30 LAB — HCG, QUANTITATIVE, PREGNANCY: hCG, Beta Chain, Quant, S: 210 m[IU]/mL — ABNORMAL HIGH (ref <5)

## 2024-10-30 LAB — LIPASE, BLOOD: Lipase: 15 U/L (ref 11–51)

## 2024-10-30 LAB — PREGNANCY, URINE: Preg Test, Ur: POSITIVE — AB

## 2024-10-30 MED ORDER — ACETAMINOPHEN 325 MG PO TABS
650.0000 mg | ORAL_TABLET | Freq: Once | ORAL | Status: AC
  Administered 2024-10-30: 650 mg via ORAL
  Filled 2024-10-30: qty 2

## 2024-10-30 MED ORDER — SODIUM CHLORIDE 0.9 % IV BOLUS
500.0000 mL | Freq: Once | INTRAVENOUS | Status: AC
  Administered 2024-10-30: 500 mL via INTRAVENOUS

## 2024-10-30 MED ORDER — ONDANSETRON HCL 4 MG/2ML IJ SOLN
4.0000 mg | Freq: Once | INTRAMUSCULAR | Status: AC
  Administered 2024-10-30: 4 mg via INTRAVENOUS
  Filled 2024-10-30: qty 2

## 2024-10-30 MED ORDER — SODIUM CHLORIDE 0.9 % IV BOLUS
1000.0000 mL | Freq: Once | INTRAVENOUS | Status: AC
  Administered 2024-10-30: 1000 mL via INTRAVENOUS

## 2024-10-30 MED ORDER — IBUPROFEN 800 MG PO TABS
800.0000 mg | ORAL_TABLET | Freq: Once | ORAL | Status: AC
Start: 2024-10-30 — End: 2024-10-30
  Administered 2024-10-30: 800 mg via ORAL
  Filled 2024-10-30: qty 1

## 2024-10-30 MED ORDER — SODIUM CHLORIDE 0.9 % IV SOLN
1.0000 g | Freq: Once | INTRAVENOUS | Status: AC
  Administered 2024-10-30: 1 g via INTRAVENOUS
  Filled 2024-10-30: qty 10

## 2024-10-30 MED ORDER — IOHEXOL 300 MG/ML  SOLN
100.0000 mL | Freq: Once | INTRAMUSCULAR | Status: AC | PRN
  Administered 2024-10-30: 85 mL via INTRAVENOUS

## 2024-10-30 MED ORDER — SODIUM CHLORIDE 0.9 % IV SOLN
1.0000 g | Freq: Once | INTRAVENOUS | Status: AC
  Administered 2024-10-31: 1 g via INTRAVENOUS
  Filled 2024-10-30: qty 10

## 2024-10-30 MED ORDER — LACTATED RINGERS IV SOLN: INTRAVENOUS | Status: AC

## 2024-10-30 NOTE — ED Provider Notes (Incomplete)
 [place] EMERGENCY DEPARTMENT AT [facility] Provider Note   CSN: [id number] Arrival date & time: 10/30/24  1203     Patient presents with: Dysuria and Abdominal Pain   Janet ANASTAS is a 23 y.o. female recently experienced a miscarriage at [redacted] weeks gestation and receiving care by outpatient facility presents today for low back pain, dysuria, chills and urinary frequency x 2 to 3 days.  Patient also reports constipation x 5 days with left upper quadrant abdominal pain and associated nausea.  Patient also reports increased vaginal bleeding and cramping sensation since arriving at the ED.  Patient denies fever, vomiting, diarrhea, chest pain, shortness of breath, any other complaints at this time.  {Add pertinent medical, surgical, social history, OB history to HPI:32947}  Dysuria Associated symptoms: abdominal pain   Abdominal Pain Associated symptoms: constipation, dysuria and vaginal bleeding        Prior to Admission medications   Medication Sig Start Date End Date Taking? Authorizing Provider  albuterol  (VENTOLIN  HFA) 108 (90 Base) MCG/ACT inhaler Inhale 2 puffs into the lungs every 6 (six) hours as needed for wheezing or shortness of breath. 07/30/21   Gladis Leonor CHRISTELLA, MD  clotrimazole  (LOTRIMIN ) 1 % cream Apply to affected area 2 times daily 07/04/23   Hazen Darryle BRAVO, FNP  fluconazole  (DIFLUCAN ) 150 MG tablet Take 1 tablet (150 mg total) by mouth once a week. 07/04/23   Hazen Darryle BRAVO, FNP  naproxen  (NAPROSYN ) 500 MG tablet Take 1 tablet (500 mg total) by mouth 2 (two) times daily. 02/21/24   Theadore Ozell CHRISTELLA, MD  ondansetron  (ZOFRAN -ODT) 4 MG disintegrating tablet Take 1 tablet (4 mg total) by mouth every 8 (eight) hours as needed for nausea or vomiting. 02/21/24   Theadore Ozell CHRISTELLA, MD    Allergies: Sulfa  antibiotics    Review of Systems  Gastrointestinal:  Positive for abdominal pain and constipation.  Genitourinary:  Positive for dysuria, frequency, pelvic pain,  urgency and vaginal bleeding.    Updated Vital Signs BP 111/63   Pulse (!) 124   Temp (!) 103 F (39.4 C) (Oral)   Resp 16   Ht 5' 1 (1.549 m)   Wt 63.5 kg   SpO2 100%   BMI 26.45 kg/m   Physical Exam Vitals and nursing note reviewed.  Constitutional:      General: She is not in acute distress.    Appearance: She is well-developed. She is not toxic-appearing.  HENT:     Head: Normocephalic and atraumatic.  Eyes:     Extraocular Movements: Extraocular movements intact.     Conjunctiva/sclera: Conjunctivae normal.  Cardiovascular:     Rate and Rhythm: Normal rate and regular rhythm.     Heart sounds: Normal heart sounds. No murmur heard. Pulmonary:     Effort: Pulmonary effort is normal. No respiratory distress.     Breath sounds: Normal breath sounds.  Abdominal:     General: There is no distension.     Palpations: Abdomen is soft.     Tenderness: There is abdominal tenderness in the periumbilical area and left upper quadrant. There is no guarding or rebound. Negative signs include Murphy's sign, Rovsing's sign and McBurney's sign.  Musculoskeletal:        General: No swelling.     Cervical back: Neck supple.  Skin:    General: Skin is warm and dry.     Capillary Refill: Capillary refill takes less than 2 seconds.  Neurological:     General:  No focal deficit present.     Mental Status: She is alert and oriented to person, place, and time.  Psychiatric:        Mood and Affect: Mood normal.     (all labs ordered are listed, but only abnormal results are displayed) Labs Reviewed  COMPREHENSIVE METABOLIC PANEL WITH GFR - Abnormal; Notable for the following components:      Result Value   Glucose, Bld 130 (*)    All other components within normal limits  CBC - Abnormal; Notable for the following components:   WBC 11.1 (*)    All other components within normal limits  URINALYSIS, ROUTINE W REFLEX MICROSCOPIC - Abnormal; Notable for the following components:    APPearance HAZY (*)    Hgb urine dipstick LARGE (*)    Ketones, ur 15 (*)    Protein, ur 100 (*)    Leukocytes,Ua LARGE (*)    Bacteria, UA FEW (*)    All other components within normal limits  PREGNANCY, URINE - Abnormal; Notable for the following components:   Preg Test, Ur POSITIVE (*)    All other components within normal limits  HCG, QUANTITATIVE, PREGNANCY - Abnormal; Notable for the following components:   hCG, Beta Chain, Quant, S 210 (*)    All other components within normal limits  LACTIC ACID, PLASMA - Abnormal; Notable for the following components:   Lactic Acid, Venous 2.3 (*)    All other components within normal limits  URINE CULTURE  CULTURE, BLOOD (ROUTINE X 2)  CULTURE, BLOOD (ROUTINE X 2)  LIPASE, BLOOD  LACTIC ACID, PLASMA    EKG: None  Radiology: CT ABDOMEN PELVIS W CONTRAST Result Date: 10/30/2024 CLINICAL DATA:  Abdominal pain. EXAM: CT ABDOMEN AND PELVIS WITH CONTRAST TECHNIQUE: Multidetector CT imaging of the abdomen and pelvis was performed using the standard protocol following bolus administration of intravenous contrast. RADIATION DOSE REDUCTION: This exam was performed according to the departmental dose-optimization program which includes automated exposure control, adjustment of the mA and/or kV according to patient size and/or use of iterative reconstruction technique. CONTRAST:  85mL OMNIPAQUE  IOHEXOL  300 MG/ML  SOLN COMPARISON:  CT abdomen pelvis dated 11/26/2022. FINDINGS: Lower chest: The visualized lung bases are clear. No intra-abdominal free air.  Small free fluid in the pelvis. Hepatobiliary: Similar appearance of hypodense lesion in the posterior right lobe of the liver consistent with a cyst or hemangioma. No biliary dilatation. The gallbladder is unremarkable. Pancreas: Unremarkable. No pancreatic ductal dilatation or surrounding inflammatory changes. Spleen: Normal in size without focal abnormality. Adrenals/Urinary Tract: The adrenal glands  unremarkable. Mild urothelial enhancement of the renal collecting systems bilaterally, left greater than right most consistent with ascending UTI and pyelonephritis. Correlation with urinalysis recommended. No abscess. There is mild thickened appearance of the bladder wall. Stomach/Bowel: Moderate stool throughout the colon. There is no bowel obstruction or active inflammation. The appendix is normal. Vascular/Lymphatic: The abdominal aorta and IVC unremarkable. No portal venous gas. There is no adenopathy. Reproductive: The uterus is anteverted. No suspicious adnexal masses. Other: None Musculoskeletal: No acute or significant osseous findings. IMPRESSION: 1. Findings most consistent with ascending UTI/pyelonephritis. Correlation with urinalysis recommended. No abscess. 2. No bowel obstruction. Normal appendix. Electronically Signed   By: Vanetta Chou M.D.   On: 10/30/2024 19:44   US  OB LESS THAN 14 WEEKS WITH OB TRANSVAGINAL Result Date: 10/30/2024 CLINICAL DATA:  Known miscarriage.  Increased bleeding and pain. EXAM: OBSTETRIC <14 WK US  AND TRANSVAGINAL OB US  TECHNIQUE: Both  transabdominal and transvaginal ultrasound examinations were performed for complete evaluation of the gestation as well as the maternal uterus, adnexal regions, and pelvic cul-de-sac. Transvaginal technique was performed to assess early pregnancy. COMPARISON:  None Available. FINDINGS: The uterus is anteverted and appears unremarkable. The endometrium may have a arcuate or partially septated morphology. The endometrium measures 9 mm in thickness. No intrauterine pregnancy identified. In the absence of previously documented intrauterine pregnancy by imaging, findings consistent with pregnancy of unknown location and differential diagnosis includes: An early IUP, recent spontaneous miscarriage, or an occult ectopic pregnancy. However, findings in keeping with provided history of recent miscarriage. Clinical correlation is recommended.  The ovaries are unremarkable. No free fluid in the pelvis. IMPRESSION: No intrauterine pregnancy identified in keeping with provided history of miscarriage. Electronically Signed   By: Vanetta Chou M.D.   On: 10/30/2024 18:30    {Document cardiac monitor, telemetry assessment procedure when appropriate:32947} Procedures   Medications Ordered in the ED  lactated ringers  infusion (has no administration in time range)  cefTRIAXone  (ROCEPHIN ) 1 g in sodium chloride  0.9 % 100 mL IVPB (has no administration in time range)  sodium chloride  0.9 % bolus 500 mL (0 mLs Intravenous Stopped 10/30/24 1939)  ondansetron  (ZOFRAN ) injection 4 mg (4 mg Intravenous Given 10/30/24 1743)  acetaminophen  (TYLENOL ) tablet 650 mg (650 mg Oral Given 10/30/24 1742)  iohexol  (OMNIPAQUE ) 300 MG/ML solution 100 mL (85 mLs Intravenous Contrast Given 10/30/24 1927)  cefTRIAXone  (ROCEPHIN ) 1 g in sodium chloride  0.9 % 100 mL IVPB (0 g Intravenous Stopped 10/30/24 2034)  sodium chloride  0.9 % bolus 1,000 mL (0 mLs Intravenous Stopped 10/30/24 2232)  sodium chloride  0.9 % bolus 500 mL (0 mLs Intravenous Stopped 10/30/24 2317)  ibuprofen (ADVIL) tablet 800 mg (800 mg Oral Given 10/30/24 2312)  ondansetron  (ZOFRAN ) injection 4 mg (4 mg Intravenous Given 10/30/24 2327)      {Click here for ABCD2, HEART and other calculators REFRESH Note before signing:1}                              Medical Decision Making Amount and/or Complexity of Data Reviewed Labs: ordered. Radiology: ordered.  Risk OTC drugs. Prescription drug management.   This patient presents to the ED for concern of urinary symptoms, constipation, vaginal bleeding and pain differential diagnosis includes retained products of conception, SBO, UTI, pyelonephritis, kidney stone, spontaneous abortion    Additional history obtained   Additional history obtained from Electronic Medical Record External records from outside source obtained and reviewed including  Care Everywhere   Lab Tests:  I Ordered, and personally interpreted labs.  The pertinent results include: CMP unremarkable, lipase 15, CBC with leukocytosis at 11.1, positive pregnancy, urine with large hemoglobin, 15 ketones, large leukocytes, 100 protein, few bacteria, greater than 50 RBCs, greater than 50 WBCs, beta hCG 210, lactic 2.3 Urine culture pending Blood cultures pending   Imaging Studies ordered:  I ordered imaging studies including ultrasound OB less than 14 weeks I independently visualized and interpreted imaging which showed no intrauterine pregnancy identified in keeping with provided history of miscarriage I agree with the radiologist interpretation CT abdomen pelvis with contrast which showed UTI and pyelonephritis.   Medicines ordered and prescription drug management:  I ordered medication including rocephin , IVF, zofran , Rocephin , ibuprofen    I have reviewed the patients home medicines and have made adjustments as needed   Problem List / ED Course:  Consulted hospitalist, Dr.  Howerter who is agreeable to admission.   {Document critical care time when appropriate  Document review of labs and clinical decision tools ie CHADS2VASC2, etc  Document your independent review of radiology images and any outside records  Document your discussion with family members, caretakers and with consultants  Document social determinants of health affecting pt's care  Document your decision making why or why not admission, treatments were needed:32947:::1}   Final diagnoses:  Pyelonephritis    ED Discharge Orders     None

## 2024-10-30 NOTE — ED Notes (Signed)
 ED Provider at bedside.

## 2024-10-30 NOTE — ED Triage Notes (Signed)
 Lower back pain, dysuria, urinary frequency onset 2-3 days ago. Denies hematuria. Also,   no BM in 5 days with abd pain. Nausea. Denies vomiting. She was approx [redacted] weeks gestation when she experienced miscarriage and received care. She is experiencing very light bleeding.

## 2024-10-30 NOTE — ED Notes (Signed)
 Pt reports being cold and shivering after starting IV fluids. Warm blankets provided.

## 2024-10-30 NOTE — ED Notes (Signed)
 Patient transported to CT

## 2024-10-30 NOTE — ED Provider Notes (Signed)
 [place] EMERGENCY DEPARTMENT AT [facility] Provider Note   CSN: [id number] Arrival date & time: 10/30/24  1203     Patient presents with: Dysuria and Abdominal Pain   Janet Rojas is a 23 y.o. female recently experienced a miscarriage at [redacted] weeks gestation and receiving care by outpatient facility presents today for low back pain, dysuria, chills and urinary frequency x 2 to 3 days.  Patient also reports constipation x 5 days with left upper quadrant abdominal pain and associated nausea.  Patient also reports increased vaginal bleeding and cramping sensation since arriving at the ED.  Patient denies fever, vomiting, diarrhea, chest pain, shortness of breath, any other complaints at this time.  {Add pertinent medical, surgical, social history, OB history to HPI:32947}  Dysuria Associated symptoms: abdominal pain   Abdominal Pain Associated symptoms: constipation, dysuria and vaginal bleeding        Prior to Admission medications   Medication Sig Start Date End Date Taking? Authorizing Provider  albuterol  (VENTOLIN  HFA) 108 (90 Base) MCG/ACT inhaler Inhale 2 puffs into the lungs every 6 (six) hours as needed for wheezing or shortness of breath. 07/30/21   Gladis Leonor CHRISTELLA, MD  clotrimazole  (LOTRIMIN ) 1 % cream Apply to affected area 2 times daily 07/04/23   Hazen Darryle BRAVO, FNP  fluconazole  (DIFLUCAN ) 150 MG tablet Take 1 tablet (150 mg total) by mouth once a week. 07/04/23   Hazen Darryle BRAVO, FNP  naproxen  (NAPROSYN ) 500 MG tablet Take 1 tablet (500 mg total) by mouth 2 (two) times daily. 02/21/24   Theadore Ozell CHRISTELLA, MD  ondansetron  (ZOFRAN -ODT) 4 MG disintegrating tablet Take 1 tablet (4 mg total) by mouth every 8 (eight) hours as needed for nausea or vomiting. 02/21/24   Theadore Ozell CHRISTELLA, MD    Allergies: Sulfa  antibiotics    Review of Systems  Gastrointestinal:  Positive for abdominal pain and constipation.  Genitourinary:  Positive for dysuria, frequency, pelvic pain,  urgency and vaginal bleeding.    Updated Vital Signs BP 118/73   Pulse 79   Temp (!) 97 F (36.1 C)   Resp 15   Ht 5' 1 (1.549 m)   Wt 63.5 kg   SpO2 99%   BMI 26.45 kg/m   Physical Exam Vitals and nursing note reviewed.  Constitutional:      General: She is not in acute distress.    Appearance: She is well-developed. She is not toxic-appearing.  HENT:     Head: Normocephalic and atraumatic.  Eyes:     Extraocular Movements: Extraocular movements intact.     Conjunctiva/sclera: Conjunctivae normal.  Cardiovascular:     Rate and Rhythm: Normal rate and regular rhythm.     Heart sounds: Normal heart sounds. No murmur heard. Pulmonary:     Effort: Pulmonary effort is normal. No respiratory distress.     Breath sounds: Normal breath sounds.  Abdominal:     General: There is no distension.     Palpations: Abdomen is soft.     Tenderness: There is abdominal tenderness in the periumbilical area and left upper quadrant. There is no guarding or rebound. Negative signs include Murphy's sign, Rovsing's sign and McBurney's sign.  Musculoskeletal:        General: No swelling.     Cervical back: Neck supple.  Skin:    General: Skin is warm and dry.     Capillary Refill: Capillary refill takes less than 2 seconds.  Neurological:     General: No focal  deficit present.     Mental Status: She is alert and oriented to person, place, and time.  Psychiatric:        Mood and Affect: Mood normal.     (all labs ordered are listed, but only abnormal results are displayed) Labs Reviewed  COMPREHENSIVE METABOLIC PANEL WITH GFR - Abnormal; Notable for the following components:      Result Value   Glucose, Bld 130 (*)    All other components within normal limits  CBC - Abnormal; Notable for the following components:   WBC 11.1 (*)    All other components within normal limits  URINALYSIS, ROUTINE W REFLEX MICROSCOPIC - Abnormal; Notable for the following components:   APPearance HAZY  (*)    Hgb urine dipstick LARGE (*)    Ketones, ur 15 (*)    Protein, ur 100 (*)    Leukocytes,Ua LARGE (*)    Bacteria, UA FEW (*)    All other components within normal limits  PREGNANCY, URINE - Abnormal; Notable for the following components:   Preg Test, Ur POSITIVE (*)    All other components within normal limits  LIPASE, BLOOD  HCG, QUANTITATIVE, PREGNANCY    EKG: None  Radiology: No results found.  {Document cardiac monitor, telemetry assessment procedure when appropriate:32947} Procedures   Medications Ordered in the ED - No data to display    {Click here for ABCD2, HEART and other calculators REFRESH Note before signing:1}                              Medical Decision Making Amount and/or Complexity of Data Reviewed Labs: ordered. Radiology: ordered.  Risk OTC drugs. Prescription drug management.   This patient presents to the ED for concern of urinary symptoms, constipation, vaginal bleeding and pain differential diagnosis includes retained products of conception, SBO, UTI, pyelonephritis, kidney stone, spontaneous abortion    Additional history obtained   Additional history obtained from Electronic Medical Record External records from outside source obtained and reviewed including Care Everywhere   Lab Tests:  I Ordered, and personally interpreted labs.  The pertinent results include: CMP unremarkable, lipase 15, CBC with leukocytosis at 11.1, positive U pride, urine with large hemoglobin, 15 ketones, large leukocytes, 100 protein, few bacteria, greater than 50 RBCs, greater than 50 WBCs, beta hCG 210   Imaging Studies ordered:  I ordered imaging studies including ultrasound OB less than 14 weeks I independently visualized and interpreted imaging which showed no intrauterine pregnancy identified in keeping with provided history of miscarriage I agree with the radiologist interpretation   Medicines ordered and prescription drug management:  I  ordered medication including ***    I have reviewed the patients home medicines and have made adjustments as needed   Problem List / ED Course:  ***   {Document critical care time when appropriate  Document review of labs and clinical decision tools ie CHADS2VASC2, etc  Document your independent review of radiology images and any outside records  Document your discussion with family members, caretakers and with consultants  Document social determinants of health affecting pt's care  Document your decision making why or why not admission, treatments were needed:32947:::1}   Final diagnoses:  None    ED Discharge Orders     None

## 2024-10-30 NOTE — Sepsis Progress Note (Signed)
 Elink monitoring for the code sepsis protocol.

## 2024-10-30 NOTE — ED Notes (Signed)
 Pt given saltine, graham crackers and shasta cola for PO challenge. Pt tolerated well. PA made aware.

## 2024-10-31 DIAGNOSIS — N390 Urinary tract infection, site not specified: Secondary | ICD-10-CM | POA: Diagnosis not present

## 2024-10-31 DIAGNOSIS — K5901 Slow transit constipation: Secondary | ICD-10-CM | POA: Diagnosis not present

## 2024-10-31 DIAGNOSIS — Z3A00 Weeks of gestation of pregnancy not specified: Secondary | ICD-10-CM | POA: Diagnosis not present

## 2024-10-31 DIAGNOSIS — Z87440 Personal history of urinary (tract) infections: Secondary | ICD-10-CM | POA: Diagnosis not present

## 2024-10-31 DIAGNOSIS — N10 Acute tubulo-interstitial nephritis: Secondary | ICD-10-CM | POA: Diagnosis present

## 2024-10-31 DIAGNOSIS — R1012 Left upper quadrant pain: Secondary | ICD-10-CM | POA: Diagnosis present

## 2024-10-31 DIAGNOSIS — Z803 Family history of malignant neoplasm of breast: Secondary | ICD-10-CM | POA: Diagnosis not present

## 2024-10-31 DIAGNOSIS — O039 Complete or unspecified spontaneous abortion without complication: Secondary | ICD-10-CM | POA: Diagnosis not present

## 2024-10-31 DIAGNOSIS — K5909 Other constipation: Secondary | ICD-10-CM | POA: Diagnosis not present

## 2024-10-31 DIAGNOSIS — Z882 Allergy status to sulfonamides status: Secondary | ICD-10-CM | POA: Diagnosis not present

## 2024-10-31 DIAGNOSIS — Z833 Family history of diabetes mellitus: Secondary | ICD-10-CM | POA: Diagnosis not present

## 2024-10-31 DIAGNOSIS — Z79899 Other long term (current) drug therapy: Secondary | ICD-10-CM | POA: Diagnosis not present

## 2024-10-31 DIAGNOSIS — B9620 Unspecified Escherichia coli [E. coli] as the cause of diseases classified elsewhere: Secondary | ICD-10-CM | POA: Diagnosis present

## 2024-10-31 DIAGNOSIS — K5900 Constipation, unspecified: Secondary | ICD-10-CM | POA: Diagnosis present

## 2024-10-31 LAB — LACTIC ACID, PLASMA: Lactic Acid, Venous: 1.5 mmol/L (ref 0.5–1.9)

## 2024-10-31 MED ORDER — SENNOSIDES-DOCUSATE SODIUM 8.6-50 MG PO TABS
2.0000 | ORAL_TABLET | Freq: Two times a day (BID) | ORAL | Status: DC
  Administered 2024-10-31 – 2024-11-02 (×5): 2 via ORAL
  Filled 2024-10-31 (×5): qty 2

## 2024-10-31 MED ORDER — SODIUM CHLORIDE 0.9 % IV SOLN
2.0000 g | INTRAVENOUS | Status: DC
  Administered 2024-10-31 – 2024-11-02 (×3): 2 g via INTRAVENOUS
  Filled 2024-10-31 (×3): qty 20

## 2024-10-31 MED ORDER — POLYETHYLENE GLYCOL 3350 17 G PO PACK
17.0000 g | PACK | Freq: Two times a day (BID) | ORAL | Status: DC
  Administered 2024-10-31 – 2024-11-01 (×3): 17 g via ORAL
  Filled 2024-10-31 (×5): qty 1

## 2024-10-31 MED ORDER — GUAIFENESIN-DM 100-10 MG/5ML PO SYRP: 10.0000 mL | ORAL_SOLUTION | ORAL | Status: DC | PRN

## 2024-10-31 MED ORDER — ACETAMINOPHEN 500 MG PO TABS
1000.0000 mg | ORAL_TABLET | Freq: Three times a day (TID) | ORAL | Status: DC
  Administered 2024-10-31 – 2024-11-02 (×6): 1000 mg via ORAL
  Filled 2024-10-31 (×6): qty 2

## 2024-10-31 MED ORDER — TRAMADOL HCL 50 MG PO TABS: 50.0000 mg | ORAL_TABLET | Freq: Four times a day (QID) | ORAL | Status: DC | PRN

## 2024-10-31 MED ORDER — SODIUM CHLORIDE 0.9 % IV SOLN: 1.0000 g | INTRAVENOUS | Status: DC

## 2024-10-31 MED ORDER — ONDANSETRON HCL 4 MG/2ML IJ SOLN
4.0000 mg | Freq: Four times a day (QID) | INTRAMUSCULAR | Status: DC | PRN
Start: 2024-10-31 — End: 2024-11-02

## 2024-10-31 NOTE — H&P (Signed)
 History and Physical    Patient: Janet Rojas FMW:[id number] DOB: [birth year] DOA: 10/30/2024 DOS: the patient was seen and examined on 10/31/2024 PCP: [facility]  Patient coming from: Home  Chief Complaint:  Chief Complaint  Patient presents with   Dysuria   Abdominal Pain   HPI: Janet Rojas is a 24 y.o. female with medical history significant of recent miscarriage at [redacted] weeks gestation, who presented to [facility] ED complaining of 2 to 3 days of new onset dysuria associated with low back discomfort as well as subjective fever.   The patient presented with a sudden onset of lower back pain that began the previous day in the morning. The patient reported accompanying symptoms of burning sensation during urination and a sense of urgency, needing to urinate again immediately after finishing. The patient did not notice any visible blood in the urine. The pain was severe enough to cause the patient to cry and led to a visit to the emergency room at another hospital at around noon. The patient had not taken any medication for the pain at home and decided to seek emergency care as it seemed beyond the help of a primary care doctor. The patient has a history of urinary tract infections (UTIs), though they are infrequent, with the last occurrence over a year ago. The patient usually receives a prescription from a doctor for treatment, typically lasting about ten days, although the specific medication was not recalled.   Vital signs in the ED were notable for the following: Temperature max 103, heart rates in the 90s to low 100s; systolic blood pressures in the high 90s to 120s mmHg; respiratory rate 19-23, oxygen saturation 99 to 100% on room air.   Labs were notable for initial lactic acid of 2.3, with repeat lactate trending down to 1.5.  CBC showed white blood cell count of 11,100.  In the setting of her recent miscarriage , beta-hCG was found to be 210.  Urinalysis showed greater  than 50 white blood cells, large leukocyte esterase and no evidence of succumbs epithelial cells.  Blood cultures x 2 as well as urine culture were collected.   Imaging notable for transvaginal ultrasound, which confirmed no evidence of retained products of conception.     Additionally, CT abdomen/pelvis with contrast showed findings consistent with ascending urinary tract infection as well as bilateral pyelonephritis, left greater than right, without evidence of ureteral stone or abscess.   Medications administered prior to transfer included the following: Rocephin .    Subsequently,pt accepted for transfer for inpatient admission to a PCU bed at [facility] for further work-up and management of the above.      Review of Systems: As mentioned in the history of present illness. All other systems reviewed and are negative. Past Medical History:  Diagnosis Date   Allergy    Anemia    IDA currently well controlled   Medical history non-contributory    PONV (postoperative nausea and vomiting)    sick after wisdom teeth   Past Surgical History:  Procedure Laterality Date   TONSILLECTOMY AND ADENOIDECTOMY Bilateral 06/17/2020   Procedure: TONSILLECTOMY AND ADENOIDECTOMY;  Surgeon: Karis Clunes, MD;  Location: [facility];  Service: ENT;  Laterality: Bilateral;   WISDOM TOOTH EXTRACTION  2019   Social History:  reports that she has never smoked. She has never used smokeless tobacco. She reports that she does not drink alcohol and does not use drugs.  Allergies  Allergen Reactions  Sulfa  Antibiotics Hives    Family History  Problem Relation Age of Onset   Diabetes Maternal Grandfather    Breast cancer Maternal Aunt    Colon cancer Neg Hx    Stomach cancer Neg Hx    Esophageal cancer Neg Hx     Prior to Admission medications   Medication Sig Start Date End Date Taking? Authorizing Provider  ibuprofen (ADVIL) 800 MG tablet Take by mouth. 10/23/24  Yes [provider]   ondansetron  (ZOFRAN ) 8 MG tablet Take 8 mg by mouth every 8 (eight) hours as needed. 10/23/24  Yes [provider]  albuterol  (VENTOLIN  HFA) 108 (90 Base) MCG/ACT inhaler Inhale 2 puffs into the lungs every 6 (six) hours as needed for wheezing or shortness of breath. 07/30/21   Gladis Leonor HERO, MD  clotrimazole  (LOTRIMIN ) 1 % cream Apply to affected area 2 times daily 07/04/23   Hazen Darryle BRAVO, FNP  fluconazole  (DIFLUCAN ) 150 MG tablet Take 1 tablet (150 mg total) by mouth once a week. 07/04/23   Hazen Darryle BRAVO, FNP  naproxen  (NAPROSYN ) 500 MG tablet Take 1 tablet (500 mg total) by mouth 2 (two) times daily. 02/21/24   Theadore Ozell HERO, MD  ondansetron  (ZOFRAN -ODT) 4 MG disintegrating tablet Take 1 tablet (4 mg total) by mouth every 8 (eight) hours as needed for nausea or vomiting. 02/21/24   Theadore Ozell HERO, MD    Physical Exam: Vitals:   10/31/24 0430 10/31/24 0622 10/31/24 0737 10/31/24 1140  BP:  107/68 103/66 120/75  Pulse: 88 82 83 98  Resp: (!) 21  19 20   Temp:  98.4 F (36.9 C) 98.3 F (36.8 C) 98.4 F (36.9 C)  TempSrc:  Oral Oral Oral  SpO2: 100% 100% 98%   Weight:  65.8 kg    Height:  5' 1 (1.549 m)     General: Alert, oriented x3, resting comfortably in no acute distress Respiratory: Lungs clear to auscultation bilaterally with normal respiratory effort; no w/r/r Cardiovascular: Regular rate and rhythm w/o m/r/g Abdomen: Soft, nontender, nondistended. Positive bowel sounds   Data Reviewed:  Lab Results  Component Value Date   WBC 11.1 (H) 10/30/2024   HGB 13.2 10/30/2024   HCT 38.2 10/30/2024   MCV 86.6 10/30/2024   PLT 218 10/30/2024   Lab Results  Component Value Date   GLUCOSE 130 (H) 10/30/2024   CALCIUM 9.6 10/30/2024   NA 137 10/30/2024   K 3.6 10/30/2024   CO2 25 10/30/2024   CL 100 10/30/2024   BUN 10 10/30/2024   CREATININE 0.70 10/30/2024   Lab Results  Component Value Date   ALT 17 10/30/2024   AST 18 10/30/2024   ALKPHOS 59  10/30/2024   BILITOT 0.7 10/30/2024   No results found for: INR, PROTIME Radiology: CT ABDOMEN PELVIS W CONTRAST Result Date: 10/30/2024 CLINICAL DATA:  Abdominal pain. EXAM: CT ABDOMEN AND PELVIS WITH CONTRAST TECHNIQUE: Multidetector CT imaging of the abdomen and pelvis was performed using the standard protocol following bolus administration of intravenous contrast. RADIATION DOSE REDUCTION: This exam was performed according to the departmental dose-optimization program which includes automated exposure control, adjustment of the mA and/or kV according to patient size and/or use of iterative reconstruction technique. CONTRAST:  85mL OMNIPAQUE  IOHEXOL  300 MG/ML  SOLN COMPARISON:  CT abdomen pelvis dated 11/26/2022. FINDINGS: Lower chest: The visualized lung bases are clear. No intra-abdominal free air.  Small free fluid in the pelvis. Hepatobiliary: Similar appearance of hypodense lesion in the posterior  right lobe of the liver consistent with a cyst or hemangioma. No biliary dilatation. The gallbladder is unremarkable. Pancreas: Unremarkable. No pancreatic ductal dilatation or surrounding inflammatory changes. Spleen: Normal in size without focal abnormality. Adrenals/Urinary Tract: The adrenal glands unremarkable. Mild urothelial enhancement of the renal collecting systems bilaterally, left greater than right most consistent with ascending UTI and pyelonephritis. Correlation with urinalysis recommended. No abscess. There is mild thickened appearance of the bladder wall. Stomach/Bowel: Moderate stool throughout the colon. There is no bowel obstruction or active inflammation. The appendix is normal. Vascular/Lymphatic: The abdominal aorta and IVC unremarkable. No portal venous gas. There is no adenopathy. Reproductive: The uterus is anteverted. No suspicious adnexal masses. Other: None Musculoskeletal: No acute or significant osseous findings. IMPRESSION: 1. Findings most consistent with ascending  UTI/pyelonephritis. Correlation with urinalysis recommended. No abscess. 2. No bowel obstruction. Normal appendix. Electronically Signed   By: Vanetta Chou M.D.   On: 10/30/2024 19:44   US  OB LESS THAN 14 WEEKS WITH OB TRANSVAGINAL Result Date: 10/30/2024 CLINICAL DATA:  Known miscarriage.  Increased bleeding and pain. EXAM: OBSTETRIC <14 WK US  AND TRANSVAGINAL OB US  TECHNIQUE: Both transabdominal and transvaginal ultrasound examinations were performed for complete evaluation of the gestation as well as the maternal uterus, adnexal regions, and pelvic cul-de-sac. Transvaginal technique was performed to assess early pregnancy. COMPARISON:  None Available. FINDINGS: The uterus is anteverted and appears unremarkable. The endometrium may have a arcuate or partially septated morphology. The endometrium measures 9 mm in thickness. No intrauterine pregnancy identified. In the absence of previously documented intrauterine pregnancy by imaging, findings consistent with pregnancy of unknown location and differential diagnosis includes: An early IUP, recent spontaneous miscarriage, or an occult ectopic pregnancy. However, findings in keeping with provided history of recent miscarriage. Clinical correlation is recommended. The ovaries are unremarkable. No free fluid in the pelvis. IMPRESSION: No intrauterine pregnancy identified in keeping with provided history of miscarriage. Electronically Signed   By: Vanetta Chou M.D.   On: 10/30/2024 18:30    Assessment and Plan: 55F h/o recent miscarriage at [redacted] weeks gestation, who presented to [facility] ED complaining of 2 to 3 days of new onset dysuria associated with low back discomfort as well as subjective fever.   B/L pyelonephritis on imaging CT abdomen/pelvis with contrast showed findings consistent with ascending urinary tract infection as well as bilateral pyelonephritis, left greater than right, without evidence of ureteral stone or abscess. -IV CTX 1g daily for  now -MIVF: LR at 1550cc/h for now -IV Zofran  prn -Tylenol  1g TID and tramadol prn -F/u urine and blood cultures; adjust abx accordingly  Constipation -MIVF above -Miralax and senna/docusate BID for now  S/p [redacted] week gestation miscariage US  neg for POC -OB/GYN consulted; apprec eval/recs   Advance Care Planning:   Code Status: Full Code   Consults: OB/GYN  Family Communication: Mother  Severity of Illness: The appropriate patient status for this patient is INPATIENT. Inpatient status is judged to be reasonable and necessary in order to provide the required intensity of service to ensure the patient's safety. The patient's presenting symptoms, physical exam findings, and initial radiographic and laboratory data in the context of their chronic comorbidities is felt to place them at high risk for further clinical deterioration. Furthermore, it is not anticipated that the patient will be medically stable for discharge from the hospital within 2 midnights of admission.   * I certify that at the point of admission it is my clinical judgment that the patient will  require inpatient hospital care spanning beyond 2 midnights from the point of admission due to high intensity of service, high risk for further deterioration and high frequency of surveillance required.*   ------- I spent 60 minutes reviewing previous notes, at the bedside counseling/discussing the treatment plan, and performing clinical documentation.  Author: Marsha Ada, MD 10/31/2024 11:59 AM  For on call review [email or web address].

## 2024-10-31 NOTE — TOC CM/SW Note (Signed)
 Transition of Care [facility]) - Inpatient Brief Assessment   Patient Details  Name: DUYEN BECKOM MRN: [id number] Date of Birth: 09-12-2001  Transition of Care [facility]) CM/SW Contact:    Lauraine FORBES Saa, LCSWA Phone Number: 10/31/2024, 10:18 AM   Clinical Narrative:  10:18 AM Per chart review, patient has a PCP and insurance. Patient does not have SNF/HH/DME history. [facility]'s preferred [facility]'s [facility] [address] [facility] and [phone] [facility] [place]. No TOC needs identified at this time. TOC will continue to follow.  Transition of Care Asessment: Insurance and Status: Insurance coverage has been reviewed Patient has primary care physician: Yes Home environment has been reviewed: Private Residence Prior level of function:: N/A Prior/Current Home Services: No current home services Social Drivers of Health Review: SDOH reviewed no interventions necessary Readmission risk has been reviewed: Yes (Currently Green 5%) Transition of care needs: no transition of care needs at this time

## 2024-10-31 NOTE — Progress Notes (Signed)
 PHARMACY NOTE:  ANTIMICROBIAL DOSAGE ADJUSTMENT  Current antimicrobial regimen includes a mismatch between antimicrobial dosage and estimated renal function.  As per policy approved by the Pharmacy & Therapeutics and Medical Executive Committees, the antimicrobial dosage will be adjusted accordingly.  Current antimicrobial dosage:  Ceftriaxone  1g IV q24  Indication: Pyelo  Renal Function:     Antimicrobial dosage has been changed to:  Ceftriaxone  2g IV q24  Additional comments:  Sergio Batch, PharmD, BCIDP, AAHIVP, CPP Infectious Disease Pharmacist 10/31/2024 8:05 AM

## 2024-10-31 NOTE — Progress Notes (Signed)
 Hospitalist Transfer Note:    Nursing staff, Please call [facility] Admits & Consults System-Wide number on Amion [phone]) as soon as patient's arrival, so appropriate admitting provider can evaluate the pt.   Transferring facility: [facility] Requesting provider: Ileana Eck, PA (EDP at [facility]) Reason for transfer: admission for further evaluation and management of bilateral pyelonephritis.   23 year old female who recently underwent miscarriage, who presented to [facility] ED complaining of 2 to 3 days of new onset dysuria associated with low back discomfort as well as subjective fever.   She reports recent miscarriage at [redacted] weeks gestation.  Vital signs in the ED were notable for the following: Temperature max 103, heart rates in the 90s to low 100s; systolic blood pressures in the high 90s to 120s mmHg; respiratory rate 19-23, oxygen saturation 99 to 100% on room air.  Labs were notable for initial lactic acid of 2.3, with repeat lactate trending down to 1.5.  CBC showed white blood cell count of 11,100.  In the setting of her recent miscarriage , beta-hCG was found to be 210.  Urinalysis showed greater than 50 white blood cells, large leukocyte esterase and no evidence of succumbs epithelial cells.  Blood cultures x 2 as well as urine culture were collected.  Imaging notable for transvaginal ultrasound, which confirmed no evidence of retained products of conception.    Additionally, CT abdomen/pelvis with contrast showed findings consistent with ascending urinary tract infection as well as bilateral pyelonephritis, left greater than right, without evidence of ureteral stone or abscess.  Medications administered prior to transfer included the following: Rocephin .   Subsequently, I accepted this patient for transfer for inpatient admission to a pcu bed at [facility] for further work-up and management of the above.      Eva Pore, DO Hospitalist

## 2024-11-01 ENCOUNTER — Encounter (HOSPITAL_COMMUNITY): Payer: Self-pay | Admitting: Internal Medicine

## 2024-11-01 DIAGNOSIS — K5901 Slow transit constipation: Secondary | ICD-10-CM | POA: Diagnosis not present

## 2024-11-01 DIAGNOSIS — Z3A00 Weeks of gestation of pregnancy not specified: Secondary | ICD-10-CM | POA: Diagnosis not present

## 2024-11-01 DIAGNOSIS — O039 Complete or unspecified spontaneous abortion without complication: Secondary | ICD-10-CM | POA: Diagnosis not present

## 2024-11-01 DIAGNOSIS — N10 Acute tubulo-interstitial nephritis: Secondary | ICD-10-CM | POA: Diagnosis not present

## 2024-11-01 LAB — GLUCOSE, CAPILLARY: Glucose-Capillary: 130 mg/dL — ABNORMAL HIGH (ref 70–99)

## 2024-11-01 MED ORDER — PSEUDOEPHEDRINE HCL 30 MG PO TABS
30.0000 mg | ORAL_TABLET | Freq: Four times a day (QID) | ORAL | Status: DC | PRN
  Administered 2024-11-01: 30 mg via ORAL
  Filled 2024-11-01 (×3): qty 1

## 2024-11-01 MED ORDER — ENOXAPARIN SODIUM 40 MG/0.4ML IJ SOSY
40.0000 mg | PREFILLED_SYRINGE | INTRAMUSCULAR | Status: DC
  Filled 2024-11-01: qty 0.4

## 2024-11-01 MED ORDER — PSEUDOEPHEDRINE HCL 15 MG/5ML PO LIQD
30.0000 mg | Freq: Four times a day (QID) | ORAL | Status: DC | PRN
  Filled 2024-11-01 (×2): qty 10

## 2024-11-01 NOTE — Plan of Care (Signed)

## 2024-11-01 NOTE — Plan of Care (Signed)
  Problem: Education: Goal: Knowledge of General Education information will improve Description: Including pain rating scale, medication(s)/side effects and non-pharmacologic comfort measures Outcome: Progressing   Problem: Health Behavior/Discharge Planning: Goal: Ability to manage health-related needs will improve Outcome: Progressing   Problem: Clinical Measurements: Goal: Respiratory complications will improve Outcome: Progressing Goal: Cardiovascular complication will be avoided Outcome: Progressing   Problem: Nutrition: Goal: Adequate nutrition will be maintained Outcome: Progressing   Problem: Elimination: Goal: Will not experience complications related to bowel motility Outcome: Progressing Goal: Will not experience complications related to urinary retention Outcome: Progressing   Problem: Pain Managment: Goal: General experience of comfort will improve and/or be controlled Outcome: Progressing

## 2024-11-01 NOTE — Progress Notes (Signed)
 Mobility Specialist Progress Note;   11/01/24 1030  Mobility  Activity Ambulated independently  Level of Assistance Independent after set-up  Assistive Device None  Distance Ambulated (ft) 300 ft  Activity Response Tolerated well  Mobility Referral Yes  Mobility visit 1 Mobility  Mobility Specialist Start Time (ACUTE ONLY) 1030  Mobility Specialist Stop Time (ACUTE ONLY) 1036  Mobility Specialist Time Calculation (min) (ACUTE ONLY) 6 min   RN requesting pt to ambulate, pt agreeable. Required no physical assistance during ambulation. VSS throughout and no c/o when asked. Pt returned back to chair and left with all needs met.   Lauraine Erm Mobility Specialist Please contact via SecureChat or [employer] [phone]

## 2024-11-01 NOTE — Progress Notes (Signed)
 Progress Note   Patient: Janet Rojas FMW:[id number] DOB: 08/31/2001 DOA: 10/30/2024     1 DOS: the patient was seen and examined on 11/01/2024   Brief hospital course: Janet Rojas is a 23 y.o. female with medical history significant of recent miscarriage at [redacted] weeks gestation, who presented to [facility] ED complaining of 2 to 3 days of new onset dysuria associated with low back discomfort as well as subjective fever.  Patient's initial lactic acid 2.3, urinalysis positive for WBC, large leukocyte esterase.  Blood cultures, urine cultures collected, CT abdomen pelvis showed ascending UTI, bilateral pyeloadmitted to [facility] service.  Assessment and Plan: Bilateral pyelonephritis. Ascending urinary tract infection. Patient will be continued on IV Rocephin  therapy. Continue gentle IV hydration. Lactic acid trended down. Continue IV Zofran  as needed for nausea and vomiting. Tylenol  as needed for pain control. Follow blood cultures.  Will follow-up final urine cultures and adjust antibiotics accordingly.  Status post 5-week gestation miscarriage- Ultrasound negative for products of conception.  Constipation- continue senna/ colace/ miralax.  Patient refused Lovenox, SCD. Understand risk of blood clots.     Out of bed to chair. Incentive spirometry. Nursing supportive care. Fall, aspiration precautions. Diet:  Diet Orders (From admission, onward)     Start     Ordered   10/31/24 1218  Diet regular Room service appropriate? Yes; Fluid consistency: Thin  Diet effective now       Question Answer Comment  Room service appropriate? Yes   Fluid consistency: Thin      10/31/24 1217           DVT prophylaxis: enoxaparin (LOVENOX) injection 40 mg Start: 11/01/24 1015 SCDs Start: 11/01/24 0928  Level of care: Progressive   Code Status: Full Code  Subjective: Patient is seen and examined today morning. She is accompanied by significant other. Has flank pain, no nausea. Able to tolerate  diet. Asks meds for nasal congestion.  Physical Exam: Vitals:   10/31/24 2335 11/01/24 0347 11/01/24 0801 11/01/24 1133  BP: (!) 105/56 113/75 112/63 110/63  Pulse: 67 76 91 97  Resp: 16 16 16 16   Temp: 98.2 F (36.8 C) 98.1 F (36.7 C) 98.3 F (36.8 C) 98.3 F (36.8 C)  TempSrc: Oral Oral Oral Oral  SpO2: 99% 100% 99% 99%  Weight:      Height:        General - Young  Caucasian female, no apparent distress HEENT - PERRLA, EOMI, atraumatic head, non tender sinuses. Lung - Clear, no rales, rhonchi, wheezes. Heart - S1, S2 heard, no murmurs, rubs, no pedal edema. Abdomen - Soft, bilateral flank tender, bowel sounds good Neuro - Alert, awake and oriented x 3, non focal exam. Skin - Warm and dry.  Data Reviewed:      Latest Ref Rng & Units 10/30/2024   12:31 PM 02/20/2024    8:04 PM 11/26/2022    7:30 PM  CBC  WBC 4.0 - 10.5 K/uL 11.1  6.4  7.3   Hemoglobin 12.0 - 15.0 g/dL 86.7  85.6  85.5   Hematocrit 36.0 - 46.0 % 38.2  40.7  42.6   Platelets 150 - 400 K/uL 218  228  238       Latest Ref Rng & Units 10/30/2024   12:31 PM 02/20/2024    8:04 PM 11/26/2022    7:30 PM  BMP  Glucose 70 - 99 mg/dL 869  93  99   BUN 6 - 20 mg/dL 10  14  11   Creatinine 0.44 - 1.00 mg/dL 9.29  9.18  9.16   Sodium 135 - 145 mmol/L 137  140  140   Potassium 3.5 - 5.1 mmol/L 3.6  3.2  3.8   Chloride 98 - 111 mmol/L 100  103  104   CO2 22 - 32 mmol/L 25  26  28    Calcium 8.9 - 10.3 mg/dL 9.6  9.6  9.5    CT ABDOMEN PELVIS W CONTRAST Result Date: 10/30/2024 CLINICAL DATA:  Abdominal pain. EXAM: CT ABDOMEN AND PELVIS WITH CONTRAST TECHNIQUE: Multidetector CT imaging of the abdomen and pelvis was performed using the standard protocol following bolus administration of intravenous contrast. RADIATION DOSE REDUCTION: This exam was performed according to the departmental dose-optimization program which includes automated exposure control, adjustment of the mA and/or kV according to patient size and/or  use of iterative reconstruction technique. CONTRAST:  85mL OMNIPAQUE  IOHEXOL  300 MG/ML  SOLN COMPARISON:  CT abdomen pelvis dated 11/26/2022. FINDINGS: Lower chest: The visualized lung bases are clear. No intra-abdominal free air.  Small free fluid in the pelvis. Hepatobiliary: Similar appearance of hypodense lesion in the posterior right lobe of the liver consistent with a cyst or hemangioma. No biliary dilatation. The gallbladder is unremarkable. Pancreas: Unremarkable. No pancreatic ductal dilatation or surrounding inflammatory changes. Spleen: Normal in size without focal abnormality. Adrenals/Urinary Tract: The adrenal glands unremarkable. Mild urothelial enhancement of the renal collecting systems bilaterally, left greater than right most consistent with ascending UTI and pyelonephritis. Correlation with urinalysis recommended. No abscess. There is mild thickened appearance of the bladder wall. Stomach/Bowel: Moderate stool throughout the colon. There is no bowel obstruction or active inflammation. The appendix is normal. Vascular/Lymphatic: The abdominal aorta and IVC unremarkable. No portal venous gas. There is no adenopathy. Reproductive: The uterus is anteverted. No suspicious adnexal masses. Other: None Musculoskeletal: No acute or significant osseous findings. IMPRESSION: 1. Findings most consistent with ascending UTI/pyelonephritis. Correlation with urinalysis recommended. No abscess. 2. No bowel obstruction. Normal appendix. Electronically Signed   By: Vanetta Chou M.D.   On: 10/30/2024 19:44   US  OB LESS THAN 14 WEEKS WITH OB TRANSVAGINAL Result Date: 10/30/2024 CLINICAL DATA:  Known miscarriage.  Increased bleeding and pain. EXAM: OBSTETRIC <14 WK US  AND TRANSVAGINAL OB US  TECHNIQUE: Both transabdominal and transvaginal ultrasound examinations were performed for complete evaluation of the gestation as well as the maternal uterus, adnexal regions, and pelvic cul-de-sac. Transvaginal technique  was performed to assess early pregnancy. COMPARISON:  None Available. FINDINGS: The uterus is anteverted and appears unremarkable. The endometrium may have a arcuate or partially septated morphology. The endometrium measures 9 mm in thickness. No intrauterine pregnancy identified. In the absence of previously documented intrauterine pregnancy by imaging, findings consistent with pregnancy of unknown location and differential diagnosis includes: An early IUP, recent spontaneous miscarriage, or an occult ectopic pregnancy. However, findings in keeping with provided history of recent miscarriage. Clinical correlation is recommended. The ovaries are unremarkable. No free fluid in the pelvis. IMPRESSION: No intrauterine pregnancy identified in keeping with provided history of miscarriage. Electronically Signed   By: Vanetta Chou M.D.   On: 10/30/2024 18:30    Family Communication: Discussed with patient, she understand and agree. All questions answered.  Disposition: Status is: Inpatient Remains inpatient appropriate because: IV antibiotics, follow urine cultures.  Planned Discharge Destination: Home     Time spent: 45 minutes  Author: Concepcion Riser, MD 11/01/2024 3:41 PM Secure chat 7am to 7pm For on call review  [email or web address]

## 2024-11-02 DIAGNOSIS — N10 Acute tubulo-interstitial nephritis: Secondary | ICD-10-CM | POA: Diagnosis not present

## 2024-11-02 DIAGNOSIS — K5909 Other constipation: Secondary | ICD-10-CM | POA: Diagnosis not present

## 2024-11-02 DIAGNOSIS — N390 Urinary tract infection, site not specified: Secondary | ICD-10-CM | POA: Diagnosis not present

## 2024-11-02 DIAGNOSIS — O039 Complete or unspecified spontaneous abortion without complication: Secondary | ICD-10-CM | POA: Diagnosis not present

## 2024-11-02 DIAGNOSIS — B9620 Unspecified Escherichia coli [E. coli] as the cause of diseases classified elsewhere: Secondary | ICD-10-CM

## 2024-11-02 LAB — BASIC METABOLIC PANEL WITH GFR
Anion gap: 11 (ref 5–15)
BUN: 8 mg/dL (ref 6–20)
CO2: 23 mmol/L (ref 22–32)
Calcium: 8.4 mg/dL — ABNORMAL LOW (ref 8.9–10.3)
Chloride: 104 mmol/L (ref 98–111)
Creatinine, Ser: 0.57 mg/dL (ref 0.44–1.00)
GFR, Estimated: >60 mL/min (ref >60)
Glucose, Bld: 100 mg/dL — ABNORMAL HIGH (ref 70–99)
Potassium: 3.6 mmol/L (ref 3.5–5.1)
Sodium: 138 mmol/L (ref 135–145)

## 2024-11-02 LAB — CBC
HCT: 33.6 % — ABNORMAL LOW (ref 36.0–46.0)
Hemoglobin: 11.5 g/dL — ABNORMAL LOW (ref 12.0–15.0)
MCH: 29.9 pg (ref 26.0–34.0)
MCHC: 34.2 g/dL (ref 30.0–36.0)
MCV: 87.5 fL (ref 80.0–100.0)
Platelets: 205 K/uL (ref 150–400)
RBC: 3.84 MIL/uL — ABNORMAL LOW (ref 3.87–5.11)
RDW: 12.7 % (ref 11.5–15.5)
WBC: 8.2 K/uL (ref 4.0–10.5)
nRBC: 0 % (ref 0.0–0.2)

## 2024-11-02 LAB — URINE CULTURE: Culture: >=100000 — AB

## 2024-11-02 MED ORDER — CEFUROXIME AXETIL 500 MG PO TABS: 500.0000 mg | ORAL_TABLET | Freq: Two times a day (BID) | ORAL | 0 refills | Status: AC

## 2024-11-02 NOTE — Progress Notes (Signed)
 Patient provided with verbal discharge instructions. Paper copy of discharge provided to patient. RN answered all questions. VSS at discharge. IV's  removed. Patient belongings sent with patient. Patient dc'd to private vehicle.

## 2024-11-02 NOTE — Plan of Care (Signed)

## 2024-11-02 NOTE — Discharge Summary (Signed)
 Physician Discharge Summary   Patient: Janet Rojas MRN: [id number] DOB: 10/28/2001  Admit date:     10/30/2024  Discharge date: 11/02/2024  Discharge Physician: Concepcion Riser   PCP: [facility]   Recommendations at discharge:    PCP follow up in 1 week.  Discharge Diagnoses: Principal Problem:   Acute pyelonephritis  Resolved Problems:   * No resolved hospital problems. *  Hospital Course: Janet Rojas is a 23 y.o. female with medical history significant of recent miscarriage at [redacted] weeks gestation, who presented to [facility] ED complaining of 2 to 3 days of new onset dysuria associated with low back discomfort as well as subjective fever.  Patient's initial lactic acid 2.3, urinalysis positive for WBC, large leukocyte esterase.  Blood cultures, urine cultures collected, CT abdomen pelvis showed ascending UTI, bilateral pyeloadmitted to [facility] service.   Assessment and Plan: Bilateral pyelonephritis. Ascending urinary tract infection. Presented with dysuria, low back pain.  UA positive, wbc, lactic acid trended down with fluids. Urine cultures positive for E.coli pansensitive. Patient got 3 doses of IV Rocephin  therapy. Transitioned to Ceftin for 7 days. Advised PCP follow up in 1 week.   Status post 5-week gestation miscarriage- Ultrasound negative for products of conception. Gyn follow up suggested.   Constipation- continue senna/ colace/ miralax.   Patient refused Lovenox, SCD during hospital stay. Understand risk of blood clots.        Consultants: none Procedures performed: none  Disposition: Home Diet recommendation:  Discharge Diet Orders (From admission, onward)     Start     Ordered   11/02/24 0000  Diet general        11/02/24 0943           Regular diet DISCHARGE MEDICATION: Allergies as of 11/02/2024       Reactions   Sulfa  Antibiotics Hives        Medication List     STOP taking these medications    ibuprofen  800 MG tablet Commonly known as: ADVIL   ondansetron  8 MG tablet Commonly known as: ZOFRAN    promethazine  25 MG tablet Commonly known as: PHENERGAN        TAKE these medications    cefUROXime 500 MG tablet Commonly known as: CEFTIN Take 1 tablet (500 mg total) by mouth 2 (two) times daily with a meal for 7 days.   VITA-C PO Take 1 tablet by mouth daily.   VITAMIN K2-VITAMIN D3 PO Take 1 capsule by mouth daily.        Discharge Exam: Filed Weights   10/30/24 1226 10/31/24 0622  Weight: 63.5 kg 65.8 kg      11/02/2024    7:59 AM 11/02/2024    7:58 AM 11/02/2024    5:36 AM  Vitals with BMI  Systolic 114 114 889  Diastolic 69 69 55  Pulse   86    General - Young  Caucasian female, no apparent distress HEENT - PERRLA, EOMI, atraumatic head, non tender sinuses. Lung - Clear, no rales, rhonchi, wheezes. Heart - S1, S2 heard, no murmurs, rubs, no pedal edema. Abdomen - Soft, bilateral flank tenderness improved, bowel sounds good Neuro - Alert, awake and oriented x 3, non focal exam. Skin - Warm and dry.  Condition at discharge: stable  The results of significant diagnostics from this hospitalization (including imaging, microbiology, ancillary and laboratory) are listed below for reference.   Imaging Studies: CT ABDOMEN PELVIS W CONTRAST Result Date: 10/30/2024 CLINICAL DATA:  Abdominal  pain. EXAM: CT ABDOMEN AND PELVIS WITH CONTRAST TECHNIQUE: Multidetector CT imaging of the abdomen and pelvis was performed using the standard protocol following bolus administration of intravenous contrast. RADIATION DOSE REDUCTION: This exam was performed according to the departmental dose-optimization program which includes automated exposure control, adjustment of the mA and/or kV according to patient size and/or use of iterative reconstruction technique. CONTRAST:  85mL OMNIPAQUE  IOHEXOL  300 MG/ML  SOLN COMPARISON:  CT abdomen pelvis dated 11/26/2022. FINDINGS: Lower chest: The  visualized lung bases are clear. No intra-abdominal free air.  Small free fluid in the pelvis. Hepatobiliary: Similar appearance of hypodense lesion in the posterior right lobe of the liver consistent with a cyst or hemangioma. No biliary dilatation. The gallbladder is unremarkable. Pancreas: Unremarkable. No pancreatic ductal dilatation or surrounding inflammatory changes. Spleen: Normal in size without focal abnormality. Adrenals/Urinary Tract: The adrenal glands unremarkable. Mild urothelial enhancement of the renal collecting systems bilaterally, left greater than right most consistent with ascending UTI and pyelonephritis. Correlation with urinalysis recommended. No abscess. There is mild thickened appearance of the bladder wall. Stomach/Bowel: Moderate stool throughout the colon. There is no bowel obstruction or active inflammation. The appendix is normal. Vascular/Lymphatic: The abdominal aorta and IVC unremarkable. No portal venous gas. There is no adenopathy. Reproductive: The uterus is anteverted. No suspicious adnexal masses. Other: None Musculoskeletal: No acute or significant osseous findings. IMPRESSION: 1. Findings most consistent with ascending UTI/pyelonephritis. Correlation with urinalysis recommended. No abscess. 2. No bowel obstruction. Normal appendix. Electronically Signed   By: Vanetta Chou M.D.   On: 10/30/2024 19:44   US  OB LESS THAN 14 WEEKS WITH OB TRANSVAGINAL Result Date: 10/30/2024 CLINICAL DATA:  Known miscarriage.  Increased bleeding and pain. EXAM: OBSTETRIC <14 WK US  AND TRANSVAGINAL OB US  TECHNIQUE: Both transabdominal and transvaginal ultrasound examinations were performed for complete evaluation of the gestation as well as the maternal uterus, adnexal regions, and pelvic cul-de-sac. Transvaginal technique was performed to assess early pregnancy. COMPARISON:  None Available. FINDINGS: The uterus is anteverted and appears unremarkable. The endometrium may have a arcuate or  partially septated morphology. The endometrium measures 9 mm in thickness. No intrauterine pregnancy identified. In the absence of previously documented intrauterine pregnancy by imaging, findings consistent with pregnancy of unknown location and differential diagnosis includes: An early IUP, recent spontaneous miscarriage, or an occult ectopic pregnancy. However, findings in keeping with provided history of recent miscarriage. Clinical correlation is recommended. The ovaries are unremarkable. No free fluid in the pelvis. IMPRESSION: No intrauterine pregnancy identified in keeping with provided history of miscarriage. Electronically Signed   By: Vanetta Chou M.D.   On: 10/30/2024 18:30    Microbiology: Results for orders placed or performed during the hospital encounter of 10/30/24  Urine Culture     Status: Abnormal   Collection Time: 10/30/24 12:36 PM   Specimen: Urine, Clean Catch  Result Value Ref Range Status   Specimen Description   Final    URINE, CLEAN CATCH Performed at [facility], [address] [place], [place] [address]    Special Requests   Final    NONE Performed at [facility], [address] [place], [place] [address]    Culture >=100,000 COLONIES/mL ESCHERICHIA COLI (A)  Final   Report Status 11/02/2024 FINAL  Final   Organism ID, Bacteria ESCHERICHIA COLI (A)  Final      Susceptibility   Escherichia coli - MIC*    AMPICILLIN <=2 SENSITIVE Sensitive     CEFAZOLIN (URINE)  Value in next row Sensitive      <=1 SENSITIVEThis is a modified FDA-approved test that has been validated and its performance characteristics determined by the reporting laboratory.  This laboratory is certified under the Clinical Laboratory Improvement Amendments CLIA as qualified to perform high complexity clinical laboratory testing.    CEFEPIME Value in next row Sensitive      <=1 SENSITIVEThis is a modified FDA-approved test that has been validated and  its performance characteristics determined by the reporting laboratory.  This laboratory is certified under the Clinical Laboratory Improvement Amendments CLIA as qualified to perform high complexity clinical laboratory testing.    ERTAPENEM Value in next row Sensitive      <=1 SENSITIVEThis is a modified FDA-approved test that has been validated and its performance characteristics determined by the reporting laboratory.  This laboratory is certified under the Clinical Laboratory Improvement Amendments CLIA as qualified to perform high complexity clinical laboratory testing.    CEFTRIAXONE  Value in next row Sensitive      <=1 SENSITIVEThis is a modified FDA-approved test that has been validated and its performance characteristics determined by the reporting laboratory.  This laboratory is certified under the Clinical Laboratory Improvement Amendments CLIA as qualified to perform high complexity clinical laboratory testing.    CIPROFLOXACIN  Value in next row Sensitive      <=1 SENSITIVEThis is a modified FDA-approved test that has been validated and its performance characteristics determined by the reporting laboratory.  This laboratory is certified under the Clinical Laboratory Improvement Amendments CLIA as qualified to perform high complexity clinical laboratory testing.    GENTAMICIN Value in next row Sensitive      <=1 SENSITIVEThis is a modified FDA-approved test that has been validated and its performance characteristics determined by the reporting laboratory.  This laboratory is certified under the Clinical Laboratory Improvement Amendments CLIA as qualified to perform high complexity clinical laboratory testing.    NITROFURANTOIN Value in next row Sensitive      <=1 SENSITIVEThis is a modified FDA-approved test that has been validated and its performance characteristics determined by the reporting laboratory.  This laboratory is certified under the Clinical Laboratory Improvement Amendments CLIA as  qualified to perform high complexity clinical laboratory testing.    TRIMETH /SULFA  Value in next row Sensitive      <=1 SENSITIVEThis is a modified FDA-approved test that has been validated and its performance characteristics determined by the reporting laboratory.  This laboratory is certified under the Clinical Laboratory Improvement Amendments CLIA as qualified to perform high complexity clinical laboratory testing.    AMPICILLIN/SULBACTAM Value in next row Sensitive      <=1 SENSITIVEThis is a modified FDA-approved test that has been validated and its performance characteristics determined by the reporting laboratory.  This laboratory is certified under the Clinical Laboratory Improvement Amendments CLIA as qualified to perform high complexity clinical laboratory testing.    PIP/TAZO Value in next row Sensitive      <=4 SENSITIVEThis is a modified FDA-approved test that has been validated and its performance characteristics determined by the reporting laboratory.  This laboratory is certified under the Clinical Laboratory Improvement Amendments CLIA as qualified to perform high complexity clinical laboratory testing.    MEROPENEM Value in next row Sensitive      <=4 SENSITIVEThis is a modified FDA-approved test that has been validated and its performance characteristics determined by the reporting laboratory.  This laboratory is certified under the Clinical Laboratory Improvement Amendments CLIA as qualified to perform high complexity  clinical laboratory testing.    * >=100,000 COLONIES/mL ESCHERICHIA COLI  Blood culture (routine x 2)     Status: None (Preliminary result)   Collection Time: 10/31/24 12:22 AM   Specimen: BLOOD  Result Value Ref Range Status   Specimen Description   Final    BLOOD RIGHT ANTECUBITAL Performed at [facility], [address] [place], [place] [address]    Special Requests   Final    BOTTLES DRAWN AEROBIC AND ANAEROBIC Blood Culture adequate  volume Performed at [facility], [address] [place], [place] [address]    Culture   Final    NO GROWTH 2 DAYS Performed at [facility], [address], [place], [place] [address]    Report Status PENDING  Incomplete  Blood culture (routine x 2)     Status: None (Preliminary result)   Collection Time: 10/31/24 12:27 AM   Specimen: BLOOD LEFT HAND  Result Value Ref Range Status   Specimen Description   Final    BLOOD LEFT HAND Performed at [facility], [address], [place], [place] [address]    Special Requests   Final    BOTTLES DRAWN AEROBIC AND ANAEROBIC Blood Culture adequate volume Performed at [facility], [address] [place], [place] [address]    Culture   Final    NO GROWTH 2 DAYS Performed at [facility], [address] [place], [place] [address]    Report Status PENDING  Incomplete    Labs: CBC: Recent Labs  Lab 10/30/24 1231 11/02/24 0219  WBC 11.1* 8.2  HGB 13.2 11.5*  HCT 38.2 33.6*  MCV 86.6 87.5  PLT 218 205   Basic Metabolic Panel: Recent Labs  Lab 10/30/24 1231 11/02/24 0219  NA 137 138  K 3.6 3.6  CL 100 104  CO2 25 23  GLUCOSE 130* 100*  BUN 10 8  CREATININE 0.70 0.57  CALCIUM 9.6 8.4*   Liver Function Tests: Recent Labs  Lab 10/30/24 1231  AST 18  ALT 17  ALKPHOS 59  BILITOT 0.7  PROT 7.5  ALBUMIN 4.6   CBG: Recent Labs  Lab 11/01/24 0847  GLUCAP 130*    Discharge time spent: 35 minutes.  Signed: Concepcion Riser, MD [facility] 11/02/2024

## 2024-11-02 NOTE — Plan of Care (Signed)
  Problem: Education: Goal: Knowledge of General Education information will improve Description: Including pain rating scale, medication(s)/side effects and non-pharmacologic comfort measures Outcome: Progressing   Problem: Health Behavior/Discharge Planning: Goal: Ability to manage health-related needs will improve Outcome: Progressing   Problem: Clinical Measurements: Goal: Diagnostic test results will improve Outcome: Progressing   Problem: Activity: Goal: Risk for activity intolerance will decrease Outcome: Progressing   Problem: Nutrition: Goal: Adequate nutrition will be maintained Outcome: Progressing   Problem: Coping: Goal: Level of anxiety will decrease Outcome: Progressing   Problem: Elimination: Goal: Will not experience complications related to bowel motility Outcome: Progressing Goal: Will not experience complications related to urinary retention Outcome: Progressing   Problem: Pain Managment: Goal: General experience of comfort will improve and/or be controlled Outcome: Progressing

## 2024-11-02 NOTE — TOC Transition Note (Signed)
 Transition of Care [facility]) - Discharge Note   Patient Details  Name: Janet Rojas MRN: [id number] Date of Birth: [birth year]  [facility] CM/SW Contact:  Roxie KANDICE Stain, RN Phone Number: 11/02/2024, 10:01 AM   Clinical Narrative:    Janet Rojas is stable to discharge home. Follow up apt on [facility]. No ICM (Inpatient Care Management) needs at this time.    Final next level of care: Home/Self Care Barriers to Discharge: Barriers Resolved   Patient Goals and CMS Choice Patient states their goals for this hospitalization and ongoing recovery are:: return home          Discharge Placement                   Home    Discharge Plan and Services Additional resources added to the After Visit Summary for                                       Social Drivers of Health (SDOH) Interventions SDOH Screenings   Food Insecurity: No Food Insecurity (10/31/2024)  Housing: Low Risk  (10/31/2024)  Transportation Needs: No Transportation Needs (10/31/2024)  Utilities: Not At Risk (10/31/2024)  Social Connections: Unknown (06/22/2022)   Received from [facility]  Tobacco Use: Low Risk  (11/01/2024)     Readmission Risk Interventions    11/02/2024   10:00 AM  Readmission Risk Prevention Plan  Post Dischage Appt Complete  Medication Screening Complete  Transportation Screening Complete

## 2024-11-03 DIAGNOSIS — O039 Complete or unspecified spontaneous abortion without complication: Secondary | ICD-10-CM | POA: Insufficient documentation

## 2024-11-03 DIAGNOSIS — N390 Urinary tract infection, site not specified: Secondary | ICD-10-CM | POA: Insufficient documentation

## 2024-11-05 LAB — CULTURE, BLOOD (ROUTINE X 2)
Culture: NO GROWTH
Culture: NO GROWTH
Special Requests: ADEQUATE
Special Requests: ADEQUATE
